# Patient Record
Sex: Male | Born: 1960 | Race: Black or African American | Hispanic: No | Smoking: Never smoker
Health system: Southern US, Community
[De-identification: ages and names within clinical notes are randomized; demographics above are authoritative.]

## PROBLEM LIST (undated history)

## (undated) DIAGNOSIS — R569 Unspecified convulsions: Secondary | ICD-10-CM

## (undated) DIAGNOSIS — Z952 Presence of prosthetic heart valve: Secondary | ICD-10-CM

## (undated) HISTORY — PX: CORONARY ARTERY BYPASS GRAFT: SHX141

## (undated) HISTORY — PX: CARDIAC VALVE REPLACEMENT: SHX585

---

## 1997-08-28 ENCOUNTER — Emergency Department (HOSPITAL_COMMUNITY): Admission: EM | Admit: 1997-08-28 | Discharge: 1997-08-28 | Payer: Self-pay | Admitting: Emergency Medicine

## 1997-10-15 ENCOUNTER — Emergency Department (HOSPITAL_COMMUNITY): Admission: EM | Admit: 1997-10-15 | Discharge: 1997-10-15 | Payer: Self-pay | Admitting: Emergency Medicine

## 2004-12-10 ENCOUNTER — Emergency Department (HOSPITAL_COMMUNITY): Admission: EM | Admit: 2004-12-10 | Discharge: 2004-12-11 | Payer: Self-pay | Admitting: Emergency Medicine

## 2005-04-04 ENCOUNTER — Encounter: Admission: RE | Admit: 2005-04-04 | Discharge: 2005-04-04 | Payer: Self-pay | Admitting: Cardiology

## 2005-04-25 ENCOUNTER — Encounter: Payer: Self-pay | Admitting: *Deleted

## 2005-04-25 ENCOUNTER — Observation Stay (HOSPITAL_COMMUNITY): Admission: AD | Admit: 2005-04-25 | Discharge: 2005-04-26 | Payer: Self-pay | Admitting: Cardiology

## 2005-07-23 ENCOUNTER — Ambulatory Visit (HOSPITAL_COMMUNITY): Admission: RE | Admit: 2005-07-23 | Discharge: 2005-07-23 | Payer: Self-pay | Admitting: Orthopedic Surgery

## 2006-05-22 ENCOUNTER — Emergency Department (HOSPITAL_COMMUNITY): Admission: EM | Admit: 2006-05-22 | Discharge: 2006-05-23 | Payer: Self-pay | Admitting: Emergency Medicine

## 2007-01-27 ENCOUNTER — Encounter
Admission: RE | Admit: 2007-01-27 | Discharge: 2007-01-27 | Payer: Self-pay | Admitting: Physical Medicine and Rehabilitation

## 2010-05-12 ENCOUNTER — Encounter: Payer: Self-pay | Admitting: Cardiology

## 2010-09-02 ENCOUNTER — Encounter (HOSPITAL_COMMUNITY): Payer: Non-veteran care

## 2010-09-04 ENCOUNTER — Encounter (HOSPITAL_COMMUNITY): Payer: Non-veteran care

## 2010-09-06 ENCOUNTER — Encounter (HOSPITAL_COMMUNITY): Payer: Non-veteran care

## 2010-09-09 ENCOUNTER — Encounter (HOSPITAL_COMMUNITY): Payer: Non-veteran care

## 2010-09-11 ENCOUNTER — Encounter (HOSPITAL_COMMUNITY): Payer: Non-veteran care

## 2010-09-13 ENCOUNTER — Encounter (HOSPITAL_COMMUNITY): Payer: Non-veteran care

## 2010-09-16 ENCOUNTER — Encounter (HOSPITAL_COMMUNITY): Payer: Non-veteran care

## 2010-09-18 ENCOUNTER — Encounter (HOSPITAL_COMMUNITY): Payer: Non-veteran care

## 2010-09-20 ENCOUNTER — Encounter (HOSPITAL_COMMUNITY): Payer: Non-veteran care

## 2010-09-23 ENCOUNTER — Encounter (HOSPITAL_COMMUNITY): Payer: Non-veteran care

## 2010-09-25 ENCOUNTER — Encounter (HOSPITAL_COMMUNITY): Payer: Non-veteran care

## 2010-09-27 ENCOUNTER — Encounter (HOSPITAL_COMMUNITY): Payer: Non-veteran care

## 2010-09-30 ENCOUNTER — Encounter (HOSPITAL_COMMUNITY): Payer: Non-veteran care

## 2010-10-02 ENCOUNTER — Encounter (HOSPITAL_COMMUNITY): Payer: Non-veteran care

## 2010-10-04 ENCOUNTER — Encounter (HOSPITAL_COMMUNITY): Payer: Non-veteran care

## 2010-10-07 ENCOUNTER — Encounter (HOSPITAL_COMMUNITY): Payer: Non-veteran care

## 2010-10-09 ENCOUNTER — Encounter (HOSPITAL_COMMUNITY): Payer: Non-veteran care

## 2010-10-11 ENCOUNTER — Encounter (HOSPITAL_COMMUNITY): Payer: Non-veteran care

## 2010-10-14 ENCOUNTER — Encounter (HOSPITAL_COMMUNITY): Payer: Non-veteran care

## 2010-10-16 ENCOUNTER — Encounter (HOSPITAL_COMMUNITY): Payer: Non-veteran care

## 2010-10-18 ENCOUNTER — Encounter (HOSPITAL_COMMUNITY): Payer: Non-veteran care

## 2010-10-21 ENCOUNTER — Encounter (HOSPITAL_COMMUNITY): Payer: Non-veteran care

## 2010-10-23 ENCOUNTER — Encounter (HOSPITAL_COMMUNITY): Payer: Non-veteran care

## 2010-10-25 ENCOUNTER — Encounter (HOSPITAL_COMMUNITY): Payer: Non-veteran care

## 2010-10-28 ENCOUNTER — Encounter (HOSPITAL_COMMUNITY): Payer: Non-veteran care

## 2010-10-30 ENCOUNTER — Encounter (HOSPITAL_COMMUNITY): Payer: Non-veteran care

## 2010-11-01 ENCOUNTER — Encounter (HOSPITAL_COMMUNITY): Payer: Non-veteran care

## 2010-11-04 ENCOUNTER — Encounter (HOSPITAL_COMMUNITY): Payer: Non-veteran care

## 2010-11-06 ENCOUNTER — Encounter (HOSPITAL_COMMUNITY): Payer: Non-veteran care

## 2010-11-08 ENCOUNTER — Encounter (HOSPITAL_COMMUNITY): Payer: Non-veteran care

## 2010-11-11 ENCOUNTER — Encounter (HOSPITAL_COMMUNITY): Payer: Non-veteran care

## 2010-11-13 ENCOUNTER — Encounter (HOSPITAL_COMMUNITY): Payer: Non-veteran care

## 2010-11-15 ENCOUNTER — Encounter (HOSPITAL_COMMUNITY): Payer: Non-veteran care

## 2010-11-18 ENCOUNTER — Encounter (HOSPITAL_COMMUNITY): Payer: Non-veteran care

## 2010-11-20 ENCOUNTER — Encounter (HOSPITAL_COMMUNITY): Payer: Non-veteran care

## 2010-11-22 ENCOUNTER — Encounter (HOSPITAL_COMMUNITY): Payer: Non-veteran care

## 2010-11-25 ENCOUNTER — Encounter (HOSPITAL_COMMUNITY): Payer: Non-veteran care

## 2010-11-27 ENCOUNTER — Encounter (HOSPITAL_COMMUNITY): Payer: Non-veteran care

## 2010-11-29 ENCOUNTER — Encounter (HOSPITAL_COMMUNITY): Payer: Non-veteran care

## 2010-12-02 ENCOUNTER — Encounter (HOSPITAL_COMMUNITY): Payer: Non-veteran care

## 2010-12-04 ENCOUNTER — Encounter (HOSPITAL_COMMUNITY): Payer: Non-veteran care

## 2010-12-06 ENCOUNTER — Encounter (HOSPITAL_COMMUNITY): Payer: Non-veteran care

## 2011-03-31 ENCOUNTER — Other Ambulatory Visit: Payer: Self-pay | Admitting: Cardiology

## 2011-03-31 DIAGNOSIS — R2240 Localized swelling, mass and lump, unspecified lower limb: Secondary | ICD-10-CM

## 2011-04-02 ENCOUNTER — Other Ambulatory Visit: Payer: Non-veteran care

## 2011-04-07 ENCOUNTER — Ambulatory Visit
Admission: RE | Admit: 2011-04-07 | Discharge: 2011-04-07 | Disposition: A | Discharge status: Home / Self Care | Payer: PRIVATE HEALTH INSURANCE | Source: Ambulatory Visit | Attending: Cardiology | Admitting: Cardiology

## 2011-04-07 DIAGNOSIS — R2240 Localized swelling, mass and lump, unspecified lower limb: Secondary | ICD-10-CM

## 2011-04-07 MED ORDER — GADOBENATE DIMEGLUMINE 529 MG/ML IV SOLN
19.0000 mL | Freq: Once | INTRAVENOUS | Status: AC | PRN
  Administered 2011-04-07: 19 mL via INTRAVENOUS

## 2012-03-26 ENCOUNTER — Observation Stay (HOSPITAL_COMMUNITY)
Admission: EM | Admit: 2012-03-26 | Discharge: 2012-03-27 | Disposition: A | Discharge status: Home / Self Care | Payer: 59 | Attending: Internal Medicine | Admitting: Internal Medicine

## 2012-03-26 ENCOUNTER — Encounter (HOSPITAL_COMMUNITY): Payer: Self-pay

## 2012-03-26 DIAGNOSIS — G40909 Epilepsy, unspecified, not intractable, without status epilepticus: Secondary | ICD-10-CM | POA: Insufficient documentation

## 2012-03-26 DIAGNOSIS — Z951 Presence of aortocoronary bypass graft: Secondary | ICD-10-CM | POA: Insufficient documentation

## 2012-03-26 DIAGNOSIS — T22239A Burn of second degree of unspecified upper arm, initial encounter: Secondary | ICD-10-CM | POA: Insufficient documentation

## 2012-03-26 DIAGNOSIS — R569 Unspecified convulsions: Secondary | ICD-10-CM

## 2012-03-26 DIAGNOSIS — R4182 Altered mental status, unspecified: Principal | ICD-10-CM | POA: Insufficient documentation

## 2012-03-26 DIAGNOSIS — X088XXA Exposure to other specified smoke, fire and flames, initial encounter: Secondary | ICD-10-CM | POA: Insufficient documentation

## 2012-03-26 DIAGNOSIS — T22231A Burn of second degree of right upper arm, initial encounter: Secondary | ICD-10-CM

## 2012-03-26 HISTORY — DX: Unspecified convulsions: R56.9

## 2012-03-26 LAB — CBC
HCT: 43.2 % (ref 39.0–52.0)
Hemoglobin: 15.1 g/dL (ref 13.0–17.0)
MCH: 29.3 pg (ref 26.0–34.0)
MCHC: 35 g/dL (ref 30.0–36.0)
MCV: 83.7 fL (ref 78.0–100.0)
Platelets: 230 10*3/uL (ref 150–400)
RBC: 5.16 MIL/uL (ref 4.22–5.81)
RDW: 13.4 % (ref 11.5–15.5)
WBC: 7.9 10*3/uL (ref 4.0–10.5)

## 2012-03-26 LAB — URINALYSIS, ROUTINE W REFLEX MICROSCOPIC
Bilirubin Urine: NEGATIVE
Glucose, UA: NEGATIVE mg/dL
Ketones, ur: NEGATIVE mg/dL
Leukocytes, UA: NEGATIVE
Nitrite: NEGATIVE
Protein, ur: 30 mg/dL — AB
Specific Gravity, Urine: 1.015 (ref 1.005–1.030)
Urobilinogen, UA: 0.2 mg/dL (ref 0.0–1.0)
pH: 5.5 (ref 5.0–8.0)

## 2012-03-26 LAB — BASIC METABOLIC PANEL
BUN: 11 mg/dL (ref 6–23)
CO2: 22 mEq/L (ref 19–32)
Calcium: 9.7 mg/dL (ref 8.4–10.5)
Chloride: 106 mEq/L (ref 96–112)
Creatinine, Ser: 1.06 mg/dL (ref 0.50–1.35)
GFR calc Af Amer: >90 mL/min (ref >90)
GFR calc non Af Amer: 80 mL/min — ABNORMAL LOW (ref >90)
Glucose, Bld: 108 mg/dL — ABNORMAL HIGH (ref 70–99)
Potassium: 4.3 mEq/L (ref 3.5–5.1)
Sodium: 140 mEq/L (ref 135–145)

## 2012-03-26 LAB — URINE MICROSCOPIC-ADD ON

## 2012-03-26 LAB — RAPID URINE DRUG SCREEN, HOSP PERFORMED
Amphetamines: NOT DETECTED
Barbiturates: NOT DETECTED
Benzodiazepines: NOT DETECTED
Cocaine: NOT DETECTED
Opiates: NOT DETECTED
Tetrahydrocannabinol: NOT DETECTED

## 2012-03-26 MED ORDER — SODIUM CHLORIDE 0.9 % IV SOLN
1000.0000 mg | Freq: Once | INTRAVENOUS | Status: DC
  Filled 2012-03-26: qty 10

## 2012-03-26 MED ORDER — LORAZEPAM 2 MG/ML IJ SOLN
INTRAMUSCULAR | Status: AC
  Administered 2012-03-26: 2 mg via INTRAVENOUS
  Filled 2012-03-26: qty 1

## 2012-03-26 MED ORDER — LORAZEPAM 2 MG/ML IJ SOLN
2.0000 mg | Freq: Once | INTRAMUSCULAR | Status: AC
  Administered 2012-03-26: 2 mg via INTRAVENOUS

## 2012-03-26 MED ORDER — FOSPHENYTOIN SODIUM 500 MG PE/10ML IJ SOLN
1800.0000 mg | Freq: Once | INTRAMUSCULAR | Status: DC
  Filled 2012-03-26: qty 36

## 2012-03-26 NOTE — ED Notes (Signed)
Per EMS, pt sister called EMS for "seizure-like activity". Pt. Confused. Denies hx of same.

## 2012-03-26 NOTE — ED Notes (Signed)
Pt father-in-law at bedside. States LSN yesterday, reports pt was quiet today but seemed to know what was happening but was a little "off". Left patient at home and received a call about an hour later from pt ex-wife stating pt was having a seizure. Pt. Ex-wife had called pt and noticed pt was not acting right. Ex-wife reports hx of seizures, father-in-law unaware of this hx, states "I have never seen anything like a seizure, but pt is very private person".

## 2012-03-26 NOTE — ED Notes (Signed)
Clifford Sullivan (father-in-law): home [phone] : cell [phone]
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
## 2012-03-26 NOTE — ED Notes (Signed)
Dallan Schonberg (ex-wife): [phone]

## 2012-03-26 NOTE — ED Notes (Signed)
Heard pt yelling from nurses station. This nurse went into room, pt sitting up in bed yelling then began to have a generalized grand-mal seizure lasting approx 1 minute, eyes deviated to left. Dr. Juleen China at bedside. 2 mg Ativan given. Pt. Suctioned, put on non-re breather. Pt. Postictal, pulling at oxygen, heavy breathing.
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
## 2012-03-26 NOTE — ED Notes (Signed)
Neurologist at bedside. 

## 2012-03-26 NOTE — Consult Note (Signed)
Reason for Consult:Seizure Referring Physician: Juleen China, S  CC: Confusion  History is obtained from:Patient's ex-wife, ex-father in law.   HPI: Clifford Sullivan is a 51 y.o. male with a hsitory of seizures, typically consisting of a staring spell and confusion. Afterwards, he can have persistent post-ictal confusion for up to several hours. His ex-wife thinks that he has had weakness with previous episodes, but is not sure which side.   In the ER here, he had a generalized seizure, and eyes were deviated to the left. It lasted for approximately 20 seconds, and spontaneously subsided. Afterwards, he was weak on the left side.   He lives with his ex father-in-law who states that "he is a very private person" but is not aware of any recent changes that would have made him have difficulty finding.    ROS: A 14 point ROS was performed and is negative except as noted in the HPI.  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  Family History: Unable to obtain due to ams  Social History: Tob: does not smoke or drink (per father in Social worker)  Exam: Current vital signs: BP 143/84  Pulse 124  Temp 98.4 F (36.9 C) (Oral)  Resp 40  SpO2 100% Vital signs in last 24 hours: Temp:  [98.4 F (36.9 C)] 98.4 F (36.9 C) (12/06 2034) Pulse Rate:  [87-126] 124  (12/06 2301) Resp:  [18-41] 40  (12/06 2301) BP: (143-193)/(84-109) 143/84 mmHg (12/06 2301) SpO2:  [95 %-100 %] 100 % (12/06 2301)  General: In bed, nad CV: rrr Mental Status: Patient is awake, post ictal He states his name, and can correctly identify his father in law in the room with him.  He is unable to give a clear history.  Cranial Nerves: II: Visual Fields are full. Pupils are equal, round, and reactive to light.  Discs are difficult to visualize. III,IV, VI: EOMI without ptosis or diploplia.  V: Facial sensation is symmetric to temperature VII: Facial movement is symmetric.  VIII: hearing is intact to voice X: Uvula elevates  symmetrically XI: Shoulder shrug is symmetric. XII: tongue is midline without atrophy or fasciculations.  Motor: Tone is normal. Bulk is normal. Moves right arm and leg well, left arm was initially plegic, but improved with some residual weakness over the course of the exam.  His left leg withdrew to noxious stimuli, but also appeared to have some weakness.  Sensory: Responds to nox stim bilaterally Deep Tendon Reflexes: 2+ and symmetric in the biceps and patellae. Plantars: Toes are downgoing bilaterally.  Cerebellar: Unable to assess due to AMS.  Gait: Unable to assess due to ams  I have reviewed labs in epic and the results pertinent to this consultation are: UA - no infection No leukocytosis.  BMP nml  I have reviewed the images obtained: -  Impression: 51 yo M with previous seizure history and breakthrough seizures. He is post-ictal at the current time, but having some improvement. I suspect his left sided weakness is a todd's phenomenon, but if not back to baseline by morning, would perform MRI/EEG.   Recommendations: 1) Dilantin level.  2) If not back to baseline 3) CT head 4) If Dilantin Theraputic, load with Keppra  5) If dilantin subtheraputic, load with fosphenytoin 20 PE/KG  Ritta Slot, MD [facility] [phone]  If 7pm- 7am, please page neurology on call at [phone].

## 2012-03-27 ENCOUNTER — Encounter (HOSPITAL_COMMUNITY): Payer: Self-pay

## 2012-03-27 ENCOUNTER — Emergency Department (HOSPITAL_COMMUNITY): Payer: 59

## 2012-03-27 DIAGNOSIS — T22239A Burn of second degree of unspecified upper arm, initial encounter: Secondary | ICD-10-CM

## 2012-03-27 DIAGNOSIS — R569 Unspecified convulsions: Secondary | ICD-10-CM | POA: Diagnosis present

## 2012-03-27 LAB — HEPATIC FUNCTION PANEL
ALT: 10 U/L (ref 0–53)
AST: 21 U/L (ref 0–37)
Albumin: 3.7 g/dL (ref 3.5–5.2)
Alkaline Phosphatase: 49 U/L (ref 39–117)
Bilirubin, Direct: 0.1 mg/dL (ref 0.0–0.3)
Indirect Bilirubin: 0.5 mg/dL (ref 0.3–0.9)
Total Bilirubin: 0.6 mg/dL (ref 0.3–1.2)
Total Protein: 6.7 g/dL (ref 6.0–8.3)

## 2012-03-27 LAB — BASIC METABOLIC PANEL
BUN: 8 mg/dL (ref 6–23)
CO2: 23 mEq/L (ref 19–32)
Chloride: 107 mEq/L (ref 96–112)
GFR calc Af Amer: >90 mL/min (ref >90)
Potassium: 4.4 mEq/L (ref 3.5–5.1)

## 2012-03-27 LAB — CBC
HCT: 39.7 % (ref 39.0–52.0)
Hemoglobin: 13.8 g/dL (ref 13.0–17.0)
MCV: 84.3 fL (ref 78.0–100.0)
WBC: 7.2 10*3/uL (ref 4.0–10.5)

## 2012-03-27 LAB — PHENYTOIN LEVEL, TOTAL: Phenytoin Lvl: <2.5 ug/mL — ABNORMAL LOW (ref 10.0–20.0)

## 2012-03-27 LAB — PROTIME-INR
INR: 2.76 — ABNORMAL HIGH (ref 0.00–1.49)
Prothrombin Time: 27.8 seconds — ABNORMAL HIGH (ref 11.6–15.2)

## 2012-03-27 LAB — MAGNESIUM: Magnesium: 2.4 mg/dL (ref 1.5–2.5)

## 2012-03-27 LAB — CK: Total CK: 368 U/L — ABNORMAL HIGH (ref 7–232)

## 2012-03-27 LAB — ETHANOL: Alcohol, Ethyl (B): <11 mg/dL (ref 0–11)

## 2012-03-27 MED ORDER — PHENYTOIN SODIUM EXTENDED 100 MG PO CAPS: 300.0000 mg | ORAL_CAPSULE | Freq: Once | ORAL | Status: DC

## 2012-03-27 MED ORDER — HYDROCODONE-ACETAMINOPHEN 5-325 MG PO TABS
1.0000 | ORAL_TABLET | ORAL | Status: DC | PRN
  Administered 2012-03-27: 2 via ORAL
  Filled 2012-03-27: qty 2

## 2012-03-27 MED ORDER — LEVETIRACETAM 500 MG PO TABS
500.0000 mg | ORAL_TABLET | Freq: Two times a day (BID) | ORAL | Status: DC
Start: 2012-03-27 — End: 2012-03-27
  Administered 2012-03-27: 500 mg via ORAL
  Filled 2012-03-27 (×2): qty 1

## 2012-03-27 MED ORDER — ENOXAPARIN SODIUM 30 MG/0.3ML ~~LOC~~ SOLN
30.0000 mg | SUBCUTANEOUS | Status: DC
  Administered 2012-03-27: 30 mg via SUBCUTANEOUS
  Filled 2012-03-27: qty 0.3

## 2012-03-27 MED ORDER — SODIUM CHLORIDE 0.9 % IV SOLN
20.0000 mg/kg | Freq: Once | INTRAVENOUS | Status: AC
  Administered 2012-03-27: 1706 mg via INTRAVENOUS
  Filled 2012-03-27: qty 34.12

## 2012-03-27 MED ORDER — ASPIRIN 325 MG PO TABS
325.0000 mg | ORAL_TABLET | Freq: Every day | ORAL | Status: DC
  Administered 2012-03-27: 325 mg via ORAL
  Filled 2012-03-27: qty 1

## 2012-03-27 MED ORDER — SODIUM CHLORIDE 0.9 % IV SOLN: INTRAVENOUS | Status: AC

## 2012-03-27 MED ORDER — LEVETIRACETAM 500 MG PO TABS: 500.0000 mg | ORAL_TABLET | Freq: Two times a day (BID) | ORAL | Status: DC

## 2012-03-27 MED ORDER — ONDANSETRON HCL 4 MG/2ML IJ SOLN: 4.0000 mg | Freq: Four times a day (QID) | INTRAMUSCULAR | Status: DC | PRN

## 2012-03-27 MED ORDER — ONDANSETRON HCL 4 MG PO TABS: 4.0000 mg | ORAL_TABLET | Freq: Four times a day (QID) | ORAL | Status: DC | PRN

## 2012-03-27 NOTE — ED Notes (Signed)
Pt. Alert and oriented to person and place. Pt. Has increased ability to recall past events that wasn't previously aware of. Able to inform this nurse of hx of seizures and past medical history. Still confused to time and situation.

## 2012-03-27 NOTE — ED Notes (Signed)
Dr. Kohut at bedside 

## 2012-03-27 NOTE — H&P (Signed)
[facility] History and Physical  Clifford Sullivan:[id number] DOB: Apr 06, 1961 DOA: 03/26/2012  Referring physician: ED physician PCP: Sheila Oats, MD   Chief Complaint: Seizure  HPI:  Pt is 51 yo male who was brought in to ED for further evaluation of what appeared to be seizure episode at home few hours earlier prior to admission. Pt appeared confussed and was starring for nearly 10-15 minutes but confusion lasted several hours. Pt is unable to provide detailed history and most of this information obtained from records and ED doctor.  ED EVENTS: another generalized seizure noted, pt deviated eyes to the left side with again post ictal confusion, loaded with phosphenytoin  Assessment and Plan: Principal Problem:  *Seizure - unclear what the provoking etiology was for this episode - will admit the pt to medical floor for further evaluation - appreciate neurology recommendations - CT head unremarkable - will provide supportive care for now: IVF, analgesia if needed - perform neuro checks per protocol - since dilantin level was low phosphenytoin was loaded  - will continue to follow up on neurology rec's  Code Status: Full Family Communication: No family at bedside Disposition Plan: Admit to medical floor  Review of Systems:  Unable to provide due to post ictal state    History reviewed. No pertinent past medical history.  Past Surgical History  Procedure Date  . Coronary artery bypass graft     Social History:  does not have a smoking history on file. He does not have any smokeless tobacco history on file. His alcohol and drug histories not on file.  No Known Allergies  Pt is in post ictal state and unable to provide  Prior to Admission medications   Not on File    Physical Exam: Filed Vitals:   03/26/12 2301 03/26/12 2342 03/27/12 0052 03/27/12 0212  BP: 143/84   144/97  Pulse: 124   93  Temp:  98.4 F (36.9 C)    TempSrc:  Rectal    Resp: 40   25   Height:   6\' 2"  (1.88 m)   Weight:   85.276 kg (188 lb)   SpO2: 100%   98%    Physical Exam  Constitutional: Appears well-developed and well-nourished. No distress.  HENT: Normocephalic. External right and left ear normal. Oropharynx is clear and moist.  Eyes: Conjunctivae and EOM are normal. PERRLA, no scleral icterus.  Neck: Normal ROM. Neck supple. No JVD. No tracheal deviation. No thyromegaly.  CVS: Regular rhythm, tachycardic, S1/S2 +, no murmurs, no gallops, no carotid bruit.  Pulmonary: Effort and breath sounds normal, no stridor, rhonchi, wheezes, rales.  Abdominal: Soft. BS +,  no distension, tenderness, rebound or guarding.  Musculoskeletal: Normal range of motion. No edema and no tenderness.  Lymphadenopathy: No lymphadenopathy noted, cervical, inguinal. Neuro: Post ictal, left upper and lower extremity weakness> right side Skin: Skin is warm and dry. No rash noted. Not diaphoretic. No erythema. No pallor.  Psychiatric: Normal mood and affect. Behavior, judgment, thought content normal.   Labs on Admission:  Basic Metabolic Panel:  Lab 03/26/12 8119 03/26/12 2121  NA -- 140  K -- 4.3  CL -- 106  CO2 -- 22  GLUCOSE -- 108*  BUN -- 11  CREATININE -- 1.06  CALCIUM -- 9.7  MG 2.4 --  PHOS -- --   Liver Function Tests:  Lab 03/26/12 2327  AST 21  ALT 10  ALKPHOS 49  BILITOT 0.6  PROT 6.7  ALBUMIN 3.7  CBC:  Lab 03/26/12 2121  WBC 7.9  NEUTROABS --  HGB 15.1  HCT 43.2  MCV 83.7  PLT 230   Cardiac Enzymes:  Lab 03/26/12 2327  CKTOTAL 368*  CKMB --  CKMBINDEX --  TROPONINI --    Radiological Exams on Admission: Ct Head Wo Contrast  03/27/2012  *RADIOLOGY REPORT*  Clinical Data: Seizures.  Headache.  CT HEAD WITHOUT CONTRAST  Technique:  Contiguous axial images were obtained from the base of the skull through the vertex without contrast.  Comparison: 05/22/2006.  Findings: No acute intracranial abnormality.  Specifically, no hemorrhage,  hydrocephalus, mass lesion, acute infarction, or significant intracranial injury.  No acute calvarial abnormality. Visualized paranasal sinuses and mastoids clear.  Orbital soft tissues unremarkable.  IMPRESSION: No acute intracranial abnormality.   Original Report Authenticated By: Charlett Nose, M.D.     EKG: Normal sinus rhythm, no ST/T wave changes  Debbora Presto, MD  [facility] Pager [phone]  If 7PM-7AM, please contact night-coverage [email or web address] Password [email or web address] 03/27/2012, 2:20 AM

## 2012-03-27 NOTE — Progress Notes (Signed)
DC instructions given to pt and pt's father in law re: new medications and continuing all home medications except for Dilantin, s/s of problems, community resources, and MyChart.  Pt and father in law verbalize understanding of all instructions.  Pt has not had any seizure activity this shift.  No s/s of any acute distress noted.

## 2012-03-27 NOTE — Discharge Summary (Signed)
Physician Discharge Summary  GIOVANNE NICKOLSON MRN: [id number] DOB/AGE: 1960-12-14 51 y.o.  PCP: Sheila Oats, MD   Admit date: 03/26/2012 Discharge date: 03/27/2012  Discharge Diagnoses:    Principal Problem:  *Sullivan     Medication List     As of 03/27/2012  8:18 AM    TAKE these medications         levETIRAcetam 500 MG tablet   Commonly known as: KEPPRA   Take 1 tablet (500 mg total) by mouth 2 (two) times daily.        Discharge Condition: Stable Disposition:    Consults: Neurology  Significant Diagnostic Studies: Ct Head Wo Contrast  03/27/2012  *RADIOLOGY REPORT*  Clinical Data: Sullivan.  Headache.  CT HEAD WITHOUT CONTRAST  Technique:  Contiguous axial images were obtained from the base of the skull through the vertex without contrast.  Comparison: 05/22/2006.  Findings: No acute intracranial abnormality.  Specifically, no hemorrhage, hydrocephalus, mass lesion, acute infarction, or significant intracranial injury.  No acute calvarial abnormality. Visualized paranasal sinuses Clifford mastoids clear.  Orbital soft tissues unremarkable.  IMPRESSION: No acute intracranial abnormality.   Original Report Authenticated By: Charlett Nose, M.D.       Microbiology: No results found for this or any previous visit (from the past 240 hour(s)).   Labs: Results for orders placed during the hospital encounter of 03/26/12 (from the past 48 hour(s))  CBC     Status: Normal   Collection Time   03/26/12  9:21 PM      Component Value Range Comment   WBC 7.9  4.0 - 10.5 K/uL    RBC 5.16  4.22 - 5.81 MIL/uL    Hemoglobin 15.1  13.0 - 17.0 g/dL    HCT 40.9  81.1 - 91.4 %    MCV 83.7  78.0 - 100.0 fL    MCH 29.3  26.0 - 34.0 pg    MCHC 35.0  30.0 - 36.0 g/dL    RDW 78.2  95.6 - 21.3 %    Platelets 230  150 - 400 K/uL   BASIC METABOLIC PANEL     Status: Abnormal   Collection Time   03/26/12  9:21 PM      Component Value Range Comment   Sodium 140  135 - 145 mEq/L     Potassium 4.3  3.5 - 5.1 mEq/L    Chloride 106  96 - 112 mEq/L    CO2 22  19 - 32 mEq/L    Glucose, Bld 108 (*) 70 - 99 mg/dL    BUN 11  6 - 23 mg/dL    Creatinine, Ser 0.86  0.50 - 1.35 mg/dL    Calcium 9.7  8.4 - 57.8 mg/dL    GFR calc non Af Amer 80 (*) >90 mL/min    GFR calc Af Amer >90  >90 mL/min   URINALYSIS, ROUTINE W REFLEX MICROSCOPIC     Status: Abnormal   Collection Time   03/26/12 10:35 PM      Component Value Range Comment   Color, Urine YELLOW  YELLOW    APPearance CLOUDY (*) CLEAR    Specific Gravity, Urine 1.015  1.005 - 1.030    pH 5.5  5.0 - 8.0    Glucose, UA NEGATIVE  NEGATIVE mg/dL    Hgb urine dipstick LARGE (*) NEGATIVE    Bilirubin Urine NEGATIVE  NEGATIVE    Ketones, ur NEGATIVE  NEGATIVE mg/dL    Protein, ur 30 (*)  NEGATIVE mg/dL    Urobilinogen, UA 0.2  0.0 - 1.0 mg/dL    Nitrite NEGATIVE  NEGATIVE    Leukocytes, UA NEGATIVE  NEGATIVE   URINE RAPID DRUG SCREEN (HOSP PERFORMED)     Status: Normal   Collection Time   03/26/12 10:35 PM      Component Value Range Comment   Opiates NONE DETECTED  NONE DETECTED    Cocaine NONE DETECTED  NONE DETECTED    Benzodiazepines NONE DETECTED  NONE DETECTED    Amphetamines NONE DETECTED  NONE DETECTED    Tetrahydrocannabinol NONE DETECTED  NONE DETECTED    Barbiturates NONE DETECTED  NONE DETECTED   URINE MICROSCOPIC-ADD ON     Status: Normal   Collection Time   03/26/12 10:35 PM      Component Value Range Comment   WBC, UA 0-2  <3 WBC/hpf    RBC / HPF 0-2  <3 RBC/hpf   ETHANOL     Status: Normal   Collection Time   03/26/12 11:27 PM      Component Value Range Comment   Alcohol, Ethyl (B) <11  0 - 11 mg/dL   CK     Status: Abnormal   Collection Time   03/26/12 11:27 PM      Component Value Range Comment   Total CK 368 (*) 7 - 232 U/L   HEPATIC FUNCTION PANEL     Status: Normal   Collection Time   03/26/12 11:27 PM      Component Value Range Comment   Total Protein 6.7  6.0 - 8.3 g/dL    Albumin 3.7  3.5 -  5.2 g/dL    AST 21  0 - 37 U/L    ALT 10  0 - 53 U/L    Alkaline Phosphatase 49  39 - 117 U/L    Total Bilirubin 0.6  0.3 - 1.2 mg/dL    Bilirubin, Direct 0.1  0.0 - 0.3 mg/dL    Indirect Bilirubin 0.5  0.3 - 0.9 mg/dL   MAGNESIUM     Status: Normal   Collection Time   03/26/12 11:27 PM      Component Value Range Comment   Magnesium 2.4  1.5 - 2.5 mg/dL   PROTIME-INR     Status: Abnormal   Collection Time   03/26/12 11:27 PM      Component Value Range Comment   Prothrombin Time 27.8 (*) 11.6 - 15.2 Sullivan    INR 2.76 (*) 0.00 - 1.49   PHENYTOIN LEVEL, TOTAL     Status: Abnormal   Collection Time   03/26/12 11:27 PM      Component Value Range Comment   Phenytoin Lvl <2.5 (*) 10.0 - 20.0 ug/mL   BASIC METABOLIC PANEL     Status: Abnormal   Collection Time   03/27/12  6:45 AM      Component Value Range Comment   Sodium 141  135 - 145 mEq/L    Potassium 4.4  3.5 - 5.1 mEq/L HEMOLYSIS AT THIS LEVEL MAY AFFECT RESULT   Chloride 107  96 - 112 mEq/L    CO2 23  19 - 32 mEq/L    Glucose, Bld 124 (*) 70 - 99 mg/dL    BUN 8  6 - 23 mg/dL    Creatinine, Ser 9.60  0.50 - 1.35 mg/dL    Calcium 9.0  8.4 - 45.4 mg/dL    GFR calc non Af Amer 84 (*) >90 mL/min  GFR calc Af Amer >90  >90 mL/min   CBC     Status: Normal   Collection Time   03/27/12  6:45 AM      Component Value Range Comment   WBC 7.2  4.0 - 10.5 K/uL    RBC 4.71  4.22 - 5.81 MIL/uL    Hemoglobin 13.8  13.0 - 17.0 g/dL    HCT 45.4  09.8 - 11.9 %    MCV 84.3  78.0 - 100.0 fL    MCH 29.3  26.0 - 34.0 pg    MCHC 34.8  30.0 - 36.0 g/dL    RDW 14.7  82.9 - 56.2 %    Platelets 214  150 - 400 K/uL      Clifford Sullivan, Clifford Sullivan, Clifford Sullivan, Clifford is not sure which side.  In the ER Sullivan, Clifford Sullivan,  Clifford Sullivan, Clifford Sullivan, Clifford Sullivan on the left side.  Clifford lives with his ex father-in-law who states that "Clifford is a very private person" Clifford is not aware of any recent changes that would have made him have difficulty finding. Clifford stated that Clifford had not been taking his Dilantin for the past several days, Clifford has one Sullivan one month   HOSPITAL COURSE:  #1 Sullivan disorder Dilantin level is subtherapeutic The patient was loaded with fosphenytoin CT scan of the head was negative Patient has been initiated on Keppra Patient has been instructed not to drive The patient is to followup with his PCP next week Urine drug screen was negative    #2 history of mitral valve replacement On Coumadin Given his history of Sullivan patient is a high-risk for trauma injury   Discharge Exam:  Blood pressure 145/90, pulse 97, temperature 98.9 F (37.2 C), temperature source Oral, resp. rate 18, height 6\' 2"  (1.88 m), weight 85.276 kg (188 lb), SpO2 97.00%.  Head: Normocephalic Clifford atraumatic.  No external signs of acute trauma  Eyes: Conjunctivae normal are normal. Right eye exhibits no discharge. Left eye exhibits no discharge.  Neck: Neck supple.  No nuchal rigidity  Cardiovascular: Normal rate, regular rhythm Clifford normal heart sounds. Exam reveals no gallop Clifford no friction rub.  No murmur heard. Sternotomy scar. Systolic murmur.  Pulmonary/Chest: Effort normal Clifford breath sounds normal. No respiratory distress.  Abdominal: Soft. Clifford exhibits no distension. There is no tenderness.  Musculoskeletal: Clifford exhibits no edema Clifford no tenderness.  Neurological:  Patient with witnessed generalized Sullivan lasting approximately 20 Sullivan Clifford subsided without intervention. No incontinence. No oral trauma.         SignedRicharda Overlie 03/27/2012, 8:18 AM

## 2012-07-28 ENCOUNTER — Encounter (HOSPITAL_COMMUNITY): Payer: Self-pay | Admitting: Emergency Medicine

## 2012-07-28 ENCOUNTER — Emergency Department (HOSPITAL_COMMUNITY)
Admission: EM | Admit: 2012-07-28 | Discharge: 2012-07-28 | Disposition: A | Discharge status: Home / Self Care | Payer: Managed Care, Other (non HMO) | Attending: Emergency Medicine | Admitting: Emergency Medicine

## 2012-07-28 DIAGNOSIS — R569 Unspecified convulsions: Secondary | ICD-10-CM

## 2012-07-28 DIAGNOSIS — Z951 Presence of aortocoronary bypass graft: Secondary | ICD-10-CM | POA: Insufficient documentation

## 2012-07-28 DIAGNOSIS — Z79899 Other long term (current) drug therapy: Secondary | ICD-10-CM | POA: Insufficient documentation

## 2012-07-28 DIAGNOSIS — G40909 Epilepsy, unspecified, not intractable, without status epilepticus: Secondary | ICD-10-CM | POA: Insufficient documentation

## 2012-07-28 LAB — POCT I-STAT, CHEM 8
BUN: 7 mg/dL (ref 6–23)
Chloride: 109 mEq/L (ref 96–112)
HCT: 51 % (ref 39.0–52.0)
Potassium: 4.2 mEq/L (ref 3.5–5.1)

## 2012-07-28 LAB — GLUCOSE, CAPILLARY

## 2012-07-28 MED ORDER — SODIUM CHLORIDE 0.9 % IV SOLN
INTRAVENOUS | Status: DC
  Administered 2012-07-28: 05:00:00 via INTRAVENOUS

## 2012-07-28 MED ORDER — LORAZEPAM 2 MG/ML IJ SOLN
1.0000 mg | Freq: Once | INTRAMUSCULAR | Status: AC
  Administered 2012-07-28: 1 mg via INTRAVENOUS
  Filled 2012-07-28: qty 1

## 2012-07-28 NOTE — ED Notes (Signed)
As per EMS, pt family witnessed seizure. Positive LOC.No N/V.pt alert pt postictal

## 2012-07-28 NOTE — ED Notes (Signed)
MD at bedside. 

## 2012-07-28 NOTE — ED Provider Notes (Signed)
History     CSN: [id number]  Arrival date & time 07/28/12  0408   First MD Initiated Contact with Patient 07/28/12 0422      Chief Complaint  Patient presents with  . Seizures    (Consider location/radiation/quality/duration/timing/severity/associated sxs/prior treatment) HPI History provided by patient and family bedside. [facility] with [facility] of seizures and takes Keppra. Gets a seizure about once every 6 months. Is taking Keppra as prescribed, he denies any new medications, missed doses, alcohol use or sleep deprivation. At home tonight, sleeping on the couch, family heard him cry out, found him lying on the floor after an apparent seizure and postictal state. EMS called. Patient noted to have a tongue lac right tongue. He denies any other pain injury trauma. At time of my evaluation he feels like he is back to his normal self. He is amnestic to event. Symptoms moderate in severity  Past Medical History  Diagnosis Date  . Seizures     Past Surgical History  Procedure Laterality Date  . Coronary artery bypass graft      No family history on file.  History  Substance Use Topics  . Smoking status: Never Smoker   . Smokeless tobacco: Not on file  . Alcohol Use: No      Review of Systems  Constitutional: Negative for fever and chills.  HENT: Negative for neck pain and neck stiffness.   Eyes: Negative for pain.  Respiratory: Negative for shortness of breath.   Cardiovascular: Negative for chest pain.  Gastrointestinal: Negative for abdominal pain.  Genitourinary: Negative for dysuria.  Musculoskeletal: Negative for back pain.  Skin: Negative for rash.  Neurological: Positive for seizures. Negative for headaches.  All other systems reviewed and are negative.    Allergies  Review of patient's allergies indicates no known allergies.  Home Medications   Current Outpatient Rx  Name  Route  Sig  Dispense  Refill  . levETIRAcetam (KEPPRA) 500 MG tablet   Oral  Take 1 tablet (500 mg total) by mouth 2 (two) times daily.   60 tablet   2     BP 140/104  Pulse 91  Temp(Src) 98.9 F (37.2 C) (Oral)  Resp 23  SpO2 97%  Physical Exam  Constitutional: He is oriented to person, place, and time. He appears well-developed and well-nourished.  HENT:  Head: Normocephalic.  Superficial right side tongue laceration with bleeding controlled. No dental tenderness.  Eyes: EOM are normal. Pupils are equal, round, and reactive to light.  Neck: Normal range of motion. Neck supple.  No C-spine tenderness or deformity  Cardiovascular: Normal rate, regular rhythm and intact distal pulses.   Pulmonary/Chest: Effort normal and breath sounds normal. No respiratory distress. He exhibits no tenderness.  Abdominal: Soft. Bowel sounds are normal. He exhibits no distension. There is no tenderness.  Musculoskeletal: Normal range of motion. He exhibits no edema.  Neurological: He is alert and oriented to person, place, and time. He has normal reflexes. No cranial nerve deficit. He exhibits normal muscle tone. Coordination normal.  Skin: Skin is warm and dry.    ED Course  Procedures (including critical care time)  Labs Reviewed  GLUCOSE, CAPILLARY - Abnormal; Notable for the following:    Glucose-Capillary 136 (*)    All other components within normal limits  POCT I-STAT, CHEM 8 - Abnormal; Notable for the following:    Glucose, Bld 135 (*)    Calcium, Ion 1.27 (*)    Hemoglobin 17.3 (*)  All other components within normal limits   IV fluids. IV Ativan.  Serial examinations with improved condition. Seizure precautions verbalized as understood. No seizure activity in the emergency dept MDM  Seizure with history of same  Reports taking Keppra as prescribed  Evaluated with labs reviewed as above  IV fluids and IV Ativan  Condition improved - has followup with neurology at [facility] - plan close outpatient followup.  Vital Signs and nursing notes  reviewed        Sunnie Nielsen, MD 07/28/12 [phone]

## 2012-08-19 ENCOUNTER — Inpatient Hospital Stay (HOSPITAL_COMMUNITY)
Admission: EM | Admit: 2012-08-19 | Discharge: 2012-08-23 | DRG: 100 | Disposition: A | Discharge status: Home / Self Care | Payer: 59 | Attending: Internal Medicine | Admitting: Internal Medicine

## 2012-08-19 ENCOUNTER — Encounter (HOSPITAL_COMMUNITY): Payer: Self-pay | Admitting: *Deleted

## 2012-08-19 DIAGNOSIS — N19 Unspecified kidney failure: Secondary | ICD-10-CM

## 2012-08-19 DIAGNOSIS — Z79899 Other long term (current) drug therapy: Secondary | ICD-10-CM

## 2012-08-19 DIAGNOSIS — G40401 Other generalized epilepsy and epileptic syndromes, not intractable, with status epilepticus: Principal | ICD-10-CM | POA: Diagnosis present

## 2012-08-19 DIAGNOSIS — Z951 Presence of aortocoronary bypass graft: Secondary | ICD-10-CM

## 2012-08-19 DIAGNOSIS — E872 Acidosis, unspecified: Secondary | ICD-10-CM | POA: Diagnosis present

## 2012-08-19 DIAGNOSIS — Z7901 Long term (current) use of anticoagulants: Secondary | ICD-10-CM

## 2012-08-19 DIAGNOSIS — Z954 Presence of other heart-valve replacement: Secondary | ICD-10-CM

## 2012-08-19 DIAGNOSIS — R569 Unspecified convulsions: Secondary | ICD-10-CM | POA: Diagnosis present

## 2012-08-19 DIAGNOSIS — I059 Rheumatic mitral valve disease, unspecified: Secondary | ICD-10-CM | POA: Diagnosis present

## 2012-08-19 DIAGNOSIS — G9340 Encephalopathy, unspecified: Secondary | ICD-10-CM | POA: Diagnosis present

## 2012-08-19 DIAGNOSIS — N179 Acute kidney failure, unspecified: Secondary | ICD-10-CM | POA: Diagnosis present

## 2012-08-19 DIAGNOSIS — G40901 Epilepsy, unspecified, not intractable, with status epilepticus: Secondary | ICD-10-CM

## 2012-08-19 HISTORY — DX: Presence of prosthetic heart valve: Z95.2

## 2012-08-19 LAB — CBC WITH DIFFERENTIAL/PLATELET
Basophils Absolute: 0 10*3/uL (ref 0.0–0.1)
Eosinophils Relative: 0 % (ref 0–5)
Lymphocytes Relative: 34 % (ref 12–46)
MCV: 83.7 fL (ref 78.0–100.0)
Neutrophils Relative %: 60 % (ref 43–77)
Platelets: 360 10*3/uL (ref 150–400)
RDW: 13.5 % (ref 11.5–15.5)
WBC: 9.3 10*3/uL (ref 4.0–10.5)

## 2012-08-19 LAB — PROTIME-INR
INR: 1.75 — ABNORMAL HIGH (ref 0.00–1.49)
Prothrombin Time: 19.8 seconds — ABNORMAL HIGH (ref 11.6–15.2)

## 2012-08-19 NOTE — ED Notes (Addendum)
According to family, pt. More confused, which started ~ 1830 and progressed up to now. During EMS transport pt. Had 45 sec. Grand mal seizure. Aura - confusion. EMS gave 2.5 mg versed iv. 20 ga. Rt. Hand. Vs 178/130, hr 118-120, rr 20's, sao2 100% nrb.~ 1830: family concerned b/c pt. Confused on the phone. Pt. Refused transport the first time. Tonite, pt. Agreed to come to hospital. Walked to EMS truck, and then proceed to have a grand mal seizure inbound to hospital.

## 2012-08-19 NOTE — ED Provider Notes (Signed)
History     CSN: [id number]  Arrival date & time 08/19/12  2300   First MD Initiated Contact with Patient 08/19/12 2303      No chief complaint on file.   (Consider location/radiation/quality/duration/timing/severity/associated sxs/prior treatment) HPI Comments: 80 y M with PMH of Seizures, mechanical heart valve and CAD s/p CABG here after a seizure.  Pt was reportedly found to be confused by his father this evening so they called EMS.  EMS found him conversant, but confused.  EMS ambulated the pt to the ambulance and put him in the stretcher and soon thereafter he reportedly vocalized loudly before having a GTC seizure.  No bowel/urinary incontinence or tongue biting.  They gave him 2.5 mg Versed IV with resolution and no further seizures.  Patient is a 52 y.o. male presenting with seizures. The history is provided by the EMS personnel.  Seizures Seizure activity on arrival: no   Seizure type:  Grand mal Episode characteristics: generalized shaking, stiffening and unresponsiveness   Postictal symptoms: confusion and somnolence   Return to baseline: no   Severity:  Mild Timing: 1 x witnessed, found confused this afternoon. Progression:  Partially resolved History of seizures: yes     Past Medical History  Diagnosis Date  . Seizures   . Mechanical heart valve present     Past Surgical History  Procedure Laterality Date  . Coronary artery bypass graft      No family history on file.  History  Substance Use Topics  . Smoking status: Never Smoker   . Smokeless tobacco: Not on file  . Alcohol Use: No      Review of Systems  Unable to perform ROS: Mental status change  Neurological: Positive for seizures.    Allergies  Review of patient's allergies indicates no known allergies.  Home Medications   Current Outpatient Rx  Name  Route  Sig  Dispense  Refill  . levETIRAcetam (KEPPRA) 500 MG tablet   Oral   Take 1 tablet (500 mg total) by mouth 2 (two) times  daily.   60 tablet   2     BP 157/96  Pulse 121  SpO2 100% Temp 98.9 RR 18 on NRB  Physical Exam  Vitals reviewed. Constitutional: He appears well-developed and well-nourished. No distress.  HENT:  Head: Normocephalic.  Right Ear: External ear normal.  Left Ear: External ear normal.  Nose: Nose normal.  Mouth/Throat: Oropharynx is clear and moist. No oropharyngeal exudate.  Eyes: Conjunctivae are normal. Pupils are equal, round, and reactive to light.  Neck: Normal range of motion. Neck supple.  Cardiovascular: Normal rate, regular rhythm, normal heart sounds and intact distal pulses.  Exam reveals no gallop and no friction rub.   No murmur heard. Pulmonary/Chest: Effort normal and breath sounds normal.  Abdominal: Soft. Bowel sounds are normal. He exhibits no distension. There is no tenderness.  Musculoskeletal: Normal range of motion. He exhibits no edema and no tenderness.  Neurological:  Will open eyes to voice Not verbalizing Not following command EOM appear intact  Skin: Skin is warm and dry.  Psychiatric: He has a normal mood and affect.    ED Course  Procedures (including critical care time)  Labs Reviewed  PROTIME-INR - Abnormal; Notable for the following:    Prothrombin Time 19.8 (*)    INR 1.75 (*)    All other components within normal limits  BASIC METABOLIC PANEL - Abnormal; Notable for the following:    CO2 15 (*)  Glucose, Bld 126 (*)    Creatinine, Ser 1.40 (*)    GFR calc non Af Amer 57 (*)    GFR calc Af Amer 66 (*)    All other components within normal limits  CBC WITH DIFFERENTIAL  URINALYSIS, ROUTINE W REFLEX MICROSCOPIC   Dg Chest 1 View  08/20/2012  *RADIOLOGY REPORT*  Clinical Data: Altered mental status.  Seizures.  CHEST - 1 VIEW  Comparison: 04/25/2005  Findings: Since the previous study, there is interval postoperative change with sternotomy wires and cardiac valve prostheses.  Shallow inspiration.  Mild cardiac enlargement with  normal pulmonary vascularity.  No focal airspace disease or consolidation in the lungs.  No blunting of costophrenic angles.  No pneumothorax. Mediastinal contours appear intact.  IMPRESSION: Postoperative changes in the mediastinum.  Shallow inspiration with mild cardiac enlargement.  No focal consolidation.   Original Report Authenticated By: Burman Nieves, M.D.    Ct Head Wo Contrast  08/20/2012  *RADIOLOGY REPORT*  Clinical Data: The history of seizures.  CT HEAD WITHOUT CONTRAST  Technique:  Contiguous axial images were obtained from the base of the skull through the vertex without contrast.  Comparison: 03/27/2012  Findings: The ventricles and sulci are symmetrical without significant effacement, displacement, or dilatation. No mass effect or midline shift. No abnormal extra-axial fluid collections. The grey-white matter junction is distinct. Basal cisterns are not effaced. No acute intracranial hemorrhage. No depressed skull fractures.  Visualized paranasal sinuses and mastoid air cells are not opacified.  Stable appearance since previous study.  IMPRESSION: No acute intracranial abnormalities.   Original Report Authenticated By: Burman Nieves, M.D.    Date: 08/20/2012  Rate: 99  Rhythm: normal sinus rhythm  QRS Axis: normal  Intervals: PR prolonged  ST/T Wave abnormalities: normal  Conduction Disutrbances:first-degree A-V block  Narrative Interpretation: First degree A-V block. When compared with ECG of 03/26/2012, no significant changes are seen.  Old EKG Reviewed: unchanged   1. Seizure       MDM   85 y M with PMH of Seizures, mechanical heart valve and CAD s/p CABG here after a seizure.  Pt was reportedly found to be confused by his father this evening so they called EMS.  EMS found him conversant, but confused.  EMS ambulated the pt to the ambulance and put him in the stretcher and soon thereafter he reportedly vocalized loudly before having a GTC seizure.  No bowel/urinary  incontinence or tongue biting.  They gave him 2.5 mg Versed IV with resolution and no further seizures.  Pt is minimally responsive on exam, but will open his eyes to voice.  Not following commands currently or answering questions.  CBC, CXR, UA, CT head, INR.  1:09 AM Pt more awake, able to tell me his name, current city and hospital he is in.  He has been loaded with 1 g Keppra and per our charts currently only takes 500 mg BID.  He is still still disoriented to other questioning.  Will plan to increase his Keppra should the patient's mental status return to baseline and he be deemed stable for discharge.  Pt care signed out to Dr. Juleen China for further monitoring.  Pt seen in conjunction with my attending, Dr. Preston Fleeting.   Oleh Genin, MD PGY-II [facility] Emergency Medicine Resident        Oleh Genin, MD 08/20/12 [phone]

## 2012-08-19 NOTE — ED Provider Notes (Signed)
52 year old male with a history of seizure disorder had a seizure at home. EMS was called and he was postictal on arrival but then had another seizure in the ambulance. He initially was unresponsive but has gradually improved level of consciousness. He has not mobile at this point but he does open his eyes and look around the room. He does not follow commands. Neurologic exam is nonfocal. Heart exam does show prominent valve click without murmur. Of note, he is on anticoagulants for a prosthetic heart valve. Therefore, CT will need to be obtained. He is not on any anticonvulsant which is amenable to checking blood levels. He'll need to be observed in the ED and given benzodiazepines as needed.   Date: 08/20/2012  Rate: 99  Rhythm: normal sinus rhythm  QRS Axis: normal  Intervals: PR prolonged  ST/T Wave abnormalities: normal  Conduction Disutrbances:first-degree A-V block   Narrative Interpretation: First degree A-V block. When compared with ECG of 03/26/2012, no significant changes are seen.  Old EKG Reviewed: unchanged    I saw and evaluated the patient, reviewed the resident's note and I agree with the findings and plan.   Dione Booze, MD 08/20/12 [phone]

## 2012-08-20 ENCOUNTER — Encounter (HOSPITAL_COMMUNITY): Payer: Self-pay | Admitting: Internal Medicine

## 2012-08-20 ENCOUNTER — Emergency Department (HOSPITAL_COMMUNITY): Payer: 59

## 2012-08-20 ENCOUNTER — Inpatient Hospital Stay (HOSPITAL_COMMUNITY): Payer: 59

## 2012-08-20 DIAGNOSIS — N19 Unspecified kidney failure: Secondary | ICD-10-CM

## 2012-08-20 DIAGNOSIS — I059 Rheumatic mitral valve disease, unspecified: Secondary | ICD-10-CM

## 2012-08-20 DIAGNOSIS — R569 Unspecified convulsions: Secondary | ICD-10-CM

## 2012-08-20 LAB — URINALYSIS, ROUTINE W REFLEX MICROSCOPIC
Ketones, ur: NEGATIVE mg/dL
Leukocytes, UA: NEGATIVE
Leukocytes, UA: NEGATIVE
Nitrite: NEGATIVE
Nitrite: NEGATIVE
Protein, ur: 30 mg/dL — AB
Specific Gravity, Urine: 1.022 (ref 1.005–1.030)
Urobilinogen, UA: 0.2 mg/dL (ref 0.0–1.0)
Urobilinogen, UA: 0.2 mg/dL (ref 0.0–1.0)
pH: 5.5 (ref 5.0–8.0)

## 2012-08-20 LAB — CBC WITH DIFFERENTIAL/PLATELET
Basophils Absolute: 0 10*3/uL (ref 0.0–0.1)
Basophils Relative: 0 % (ref 0–1)
MCHC: 35.6 g/dL (ref 30.0–36.0)
Monocytes Absolute: 0.5 10*3/uL (ref 0.1–1.0)
Neutro Abs: 6 10*3/uL (ref 1.7–7.7)
Neutrophils Relative %: 73 % (ref 43–77)
Platelets: 299 10*3/uL (ref 150–400)
RDW: 13.8 % (ref 11.5–15.5)
WBC: 8.3 10*3/uL (ref 4.0–10.5)

## 2012-08-20 LAB — COMPREHENSIVE METABOLIC PANEL
Alkaline Phosphatase: 63 U/L (ref 39–117)
BUN: 9 mg/dL (ref 6–23)
CO2: 23 mEq/L (ref 19–32)
Calcium: 9.4 mg/dL (ref 8.4–10.5)
GFR calc Af Amer: 89 mL/min — ABNORMAL LOW (ref >90)
GFR calc non Af Amer: 77 mL/min — ABNORMAL LOW (ref >90)
Glucose, Bld: 104 mg/dL — ABNORMAL HIGH (ref 70–99)
Total Protein: 7.4 g/dL (ref 6.0–8.3)

## 2012-08-20 LAB — POCT I-STAT 3, ART BLOOD GAS (G3+)
Acid-base deficit: 1 mmol/L (ref 0.0–2.0)
Bicarbonate: 23.5 mEq/L (ref 20.0–24.0)
O2 Saturation: 94 %
TCO2: 25 mmol/L (ref 0–100)
pO2, Arterial: 70 mmHg — ABNORMAL LOW (ref 80.0–100.0)

## 2012-08-20 LAB — RAPID URINE DRUG SCREEN, HOSP PERFORMED
Amphetamines: NOT DETECTED
Barbiturates: NOT DETECTED
Benzodiazepines: POSITIVE — AB

## 2012-08-20 LAB — HEPARIN LEVEL (UNFRACTIONATED): Heparin Unfractionated: 0.41 IU/mL (ref 0.30–0.70)

## 2012-08-20 LAB — URINE MICROSCOPIC-ADD ON

## 2012-08-20 LAB — BASIC METABOLIC PANEL
Calcium: 10.5 mg/dL (ref 8.4–10.5)
Creatinine, Ser: 1.4 mg/dL — ABNORMAL HIGH (ref 0.50–1.35)
GFR calc non Af Amer: 57 mL/min — ABNORMAL LOW (ref >90)
Sodium: 142 mEq/L (ref 135–145)

## 2012-08-20 LAB — RPR: RPR Ser Ql: NONREACTIVE

## 2012-08-20 LAB — GLUCOSE, CAPILLARY: Glucose-Capillary: 113 mg/dL — ABNORMAL HIGH (ref 70–99)

## 2012-08-20 LAB — LEVETIRACETAM LEVEL: Levetiracetam Lvl: <5 ug/mL — ABNORMAL LOW (ref 5.0–30.0)

## 2012-08-20 LAB — PHENYTOIN LEVEL, TOTAL: Phenytoin Lvl: <2.5 ug/mL — ABNORMAL LOW (ref 10.0–20.0)

## 2012-08-20 MED ORDER — SODIUM CHLORIDE 0.9 % IJ SOLN
3.0000 mL | Freq: Two times a day (BID) | INTRAMUSCULAR | Status: DC
  Administered 2012-08-20 – 2012-08-22 (×4): 3 mL via INTRAVENOUS

## 2012-08-20 MED ORDER — SODIUM CHLORIDE 0.9 % IV SOLN
INTRAVENOUS | Status: DC
  Administered 2012-08-20 – 2012-08-22 (×3): via INTRAVENOUS

## 2012-08-20 MED ORDER — SODIUM CHLORIDE 0.9 % IV SOLN
100.0000 mg | Freq: Two times a day (BID) | INTRAVENOUS | Status: DC
  Administered 2012-08-21: 100 mg via INTRAVENOUS
  Filled 2012-08-20 (×3): qty 10

## 2012-08-20 MED ORDER — ONDANSETRON HCL 4 MG/2ML IJ SOLN: 4.0000 mg | Freq: Four times a day (QID) | INTRAMUSCULAR | Status: DC | PRN

## 2012-08-20 MED ORDER — FOSPHENYTOIN SODIUM 500 MG PE/10ML IJ SOLN
20.0000 mg/kg | Freq: Once | INTRAMUSCULAR | Status: AC
  Administered 2012-08-20: 2006 mg via INTRAVENOUS
  Filled 2012-08-20 (×2): qty 40.12

## 2012-08-20 MED ORDER — ONDANSETRON HCL 4 MG PO TABS: 4.0000 mg | ORAL_TABLET | Freq: Four times a day (QID) | ORAL | Status: DC | PRN

## 2012-08-20 MED ORDER — LEVETIRACETAM 1000 MG PO TABS: 1000.0000 mg | ORAL_TABLET | Freq: Two times a day (BID) | ORAL | Status: DC

## 2012-08-20 MED ORDER — WARFARIN - PHARMACIST DOSING INPATIENT: Freq: Every day | Status: DC

## 2012-08-20 MED ORDER — SODIUM CHLORIDE 0.9 % IV SOLN
1000.0000 mg | Freq: Once | INTRAVENOUS | Status: AC
  Administered 2012-08-20: 1000 mg via INTRAVENOUS
  Filled 2012-08-20: qty 10

## 2012-08-20 MED ORDER — LEVETIRACETAM 500 MG PO TABS: 500.0000 mg | ORAL_TABLET | Freq: Two times a day (BID) | ORAL | Status: DC

## 2012-08-20 MED ORDER — LEVETIRACETAM 500 MG PO TABS
500.0000 mg | ORAL_TABLET | Freq: Two times a day (BID) | ORAL | Status: DC
  Administered 2012-08-20: 500 mg via ORAL
  Filled 2012-08-20 (×2): qty 1

## 2012-08-20 MED ORDER — LORAZEPAM 2 MG/ML IJ SOLN: 1.0000 mg | INTRAMUSCULAR | Status: DC | PRN

## 2012-08-20 MED ORDER — ACETAMINOPHEN 325 MG PO TABS: 650.0000 mg | ORAL_TABLET | Freq: Four times a day (QID) | ORAL | Status: DC | PRN

## 2012-08-20 MED ORDER — SODIUM CHLORIDE 0.9 % IV SOLN
200.0000 mg | Freq: Once | INTRAVENOUS | Status: AC
  Administered 2012-08-20: 200 mg via INTRAVENOUS
  Filled 2012-08-20: qty 20

## 2012-08-20 MED ORDER — WARFARIN SODIUM 7.5 MG PO TABS
7.5000 mg | ORAL_TABLET | Freq: Once | ORAL | Status: AC
  Administered 2012-08-20: 7.5 mg via ORAL
  Filled 2012-08-20: qty 1

## 2012-08-20 MED ORDER — ACETAMINOPHEN 650 MG RE SUPP: 650.0000 mg | Freq: Four times a day (QID) | RECTAL | Status: DC | PRN

## 2012-08-20 MED ORDER — SODIUM CHLORIDE 0.9 % IV SOLN
INTRAVENOUS | Status: AC
  Administered 2012-08-20: 20:00:00 via INTRAVENOUS

## 2012-08-20 MED ORDER — LEVETIRACETAM 500 MG PO TABS
1000.0000 mg | ORAL_TABLET | Freq: Two times a day (BID) | ORAL | Status: DC
  Administered 2012-08-20 – 2012-08-23 (×6): 1000 mg via ORAL
  Filled 2012-08-20 (×7): qty 2

## 2012-08-20 MED ORDER — HEPARIN (PORCINE) IN NACL 100-0.45 UNIT/ML-% IJ SOLN
1200.0000 [IU]/h | INTRAMUSCULAR | Status: DC
  Administered 2012-08-20 – 2012-08-21 (×2): 1350 [IU]/h via INTRAVENOUS
  Administered 2012-08-22: 1200 [IU]/h via INTRAVENOUS
  Filled 2012-08-20 (×5): qty 250

## 2012-08-20 NOTE — Progress Notes (Signed)
1400 came back from EEG DEPT . With EEG leads attached to pt's head connected to  EEG monitoer . PT informed

## 2012-08-20 NOTE — Progress Notes (Signed)
ANTICOAGULATION CONSULT NOTE - Initial Consult  Pharmacy Consult for Heparin Indication: h/o mechanical MVR  No Known Allergies  Patient Measurements: Height: 6' 2.02" (188 cm) Weight: 187 lb 6.3 oz (85 kg) (03/2012) IBW/kg (Calculated) : 82.24  Vital Signs: BP: 152/88 mmHg (05/02 0600) Pulse Rate: 113 (05/02 0600)  Labs:  Recent Labs  08/19/12 2330 08/19/12 2345  HGB  --  15.9  HCT  --  45.2  PLT  --  360  LABPROT 19.8*  --   INR 1.75*  --   CREATININE  --  1.40*    Estimated Creatinine Clearance: 72.6 ml/min (by C-G formula based on Cr of 1.4).   Medical History: Past Medical History  Diagnosis Date  . Seizures   . Mechanical heart valve present     Medications:  Pending clarification  Assessment: 52 yo male admitted with confusion/seizures, h/o [place] MVR and INR subtherapeutic, for heaprin  Goal of Therapy:  INR 2.5-3.5 Heparin level 0.3-0.7 Monitor platelets by anticoagulation protocol: Yes   Plan:  Start heparin 1350 units/hr Check heparin level in 8 hours. F/U home Coumadin regimen  Eddie Candle 08/20/2012,8:02 AM

## 2012-08-20 NOTE — Progress Notes (Signed)
ANTICOAGULATION CONSULT NOTE - Follow Up Consult  Pharmacy Consult for Heparin and Coumadin Indication: mechanical MVR  No Known Allergies  Patient Measurements: Height: 6\' 2"  (188 cm) Weight: 187 lb 6.3 oz (85 kg) (03/2012) IBW/kg (Calculated) : 82.2 Heparin Dosing Weight: 85 kg  Vital Signs: Temp: 98.3 F (36.8 C) (05/02 0850) Temp src: Oral (05/02 0850) BP: 135/93 mmHg (05/02 0850) Pulse Rate: 111 (05/02 0850)  Labs:  Recent Labs  08/19/12 2330 08/19/12 2345 08/20/12 0822  HGB  --  15.9  --   HCT  --  45.2  --   PLT  --  360  --   LABPROT 19.8*  --   --   INR 1.75*  --   --   CREATININE  --  1.40* 1.09    Estimated Creatinine Clearance: 93.2 ml/min (by C-G formula based on Cr of 1.09).  Assessment:  Hx mechanical MVR, patient tells me in 2002, but can't currently recall where.  IV heparin already ordered, also to continue Coumadin.  Home Coumadin dose is 5 mg daily.  Unsure when last dose taken, but INR subtherapeutic.  Patient also cannot currently recall who follows his Coumadin and when he last had his PT/INR checked, but he does relate no recent dose changes.  Goal of Therapy:  Heparin level 0.3-0.7 units/ml INR 2.5-3.5 Monitor platelets by anticoagulation protocol: Yes   Plan:   Heparin drip at 1350 units/hr as ordered.  Heparin level ~ 8 hrs after drip started.  Coumadin 7.5 mg today.  Daily heparin level, PT/INR and CBC.  Dennie Fetters, RPh Pager: [phone] 08/20/2012,11:33 AM

## 2012-08-20 NOTE — Progress Notes (Signed)
Anticoagulation Consult per Pharmacy Heparin for mechanical MVR  Heparin level = 0.41 on 1350 units/hr Goal heparin level = 0.3-0.7  Heparin level is in desired range. No changes needed. Will f/u daily heparin level and CBC.  Cardell Peach, PharmD

## 2012-08-20 NOTE — Progress Notes (Signed)
Utilization Review Completed.   Curtis Uriarte, RN, BSN Nurse Case Manager  [phone]  

## 2012-08-20 NOTE — ED Notes (Addendum)
Pt. Found standing at bedside; pt. Pleasantly confused still. When asked where he is, why he is here, he cannot tell me.  He knows his job and full name. Dr. Juleen China made aware. Continue to watch patient.

## 2012-08-20 NOTE — H&P (Signed)
[facility] History and Physical  Clifford Sullivan ZOX:[id number] DOB: February 08, 1961 DOA: 08/19/2012  Referring physician: ER physician. PCP: No PCP Per Patient   Chief Complaint: Seizures.  Most of the history was obtained from ER physician, patient's nurse and old records. Patient is confused after the seizure and unable to provide history. Unable to reach patient's family with the number provided.  HPI: Clifford Sullivan is a 52 y.o. 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 As per the history provided patient was found to be increasingly confused since last evening and since his confusion persisted EMS was called later and once EMS was bringing the patient to the hospital en route patient developed a seizure which was tonic-clonic and lasted for 45 seconds and was aborted after IV midazolam was given. In the ER patient had CT head which did not show any acute. Patient was loaded with Keppra 1 g IV. Chest x-ray and EKG was unremarkable. Patient is still confused after the seizures at this time has been admitted for further management. Patient does not provide much history as he is unable to recall most of his medical history and what medications he was taking. On exam was nonfocal alert awake and oriented to his name only. Patient otherwise denies any pain fever chills.  Review of Systems: As presented in the history of presenting illness, rest negative.  Past Medical History  Diagnosis Date  . Seizures   . Mechanical heart valve present    Past Surgical History  Procedure Laterality Date  . Coronary artery bypass graft    . Cardiac valve replacement     Social History:  reports that he has never smoked. He does not have any smokeless tobacco history on file. He reports that he does not drink alcohol or use illicit drugs. Lives with his ex father-in-law as per the report.  where does patient  live-- Not sure.  Can patient participate in ADLs?  No Known Allergies  Family History  Problem Relation Age of Onset  . Family history unknown: Yes      Prior to Admission medications   Medication Sig Start Date End Date Taking? Authorizing Provider  levETIRAcetam (KEPPRA) 1000 MG tablet Take 1 tablet (1,000 mg total) by mouth 2 (two) times daily. 08/20/12   Oleh Genin, MD   Physical Exam: Filed Vitals:   08/19/12 2311 08/20/12 0228 08/20/12 0600  BP: 157/96 135/79 152/88  Pulse: 121 88 113  Resp:   25  SpO2: 100% 100% 97%     General:  Well-developed well-nourished.   Eyes: And icteric no pallor.   ENT: No discharge from the ears eyes nose and mouth.   Neck: No mass felt.   Cardiovascular: S1-S2 heard.   Respiratory: No rhonchi or crepitations.   Abdomen: Soft nontender bowel sounds present.   Skin: No rash.   Musculoskeletal: No edema.   Psychiatric: Alert awake oriented to his name only.   Neurologic: Follows commands and moves all extremities. PEERLA positive and no facial symmetry. Tongue is midline.  Labs on Admission:  Basic Metabolic Panel:  Recent Labs Lab 08/19/12 2345  NA 142  K 4.4  CL 100  CO2 15*  GLUCOSE 126*  BUN 9  CREATININE 1.40*  CALCIUM 10.5   Liver Function Tests: No results found for this basename: AST, ALT, ALKPHOS, BILITOT, PROT, ALBUMIN,  in the last 168 hours  No results found for this basename: LIPASE, AMYLASE,  in the last 168 hours No results found for this basename: AMMONIA,  in the last 168 hours CBC:  Recent Labs Lab 08/19/12 2345  WBC 9.3  NEUTROABS 5.6  HGB 15.9  HCT 45.2  MCV 83.7  PLT 360   Cardiac Enzymes: No results found for this basename: CKTOTAL, CKMB, CKMBINDEX, TROPONINI,  in the last 168 hours  BNP (last 3 results) No results found for this basename: PROBNP,  in the last 8760 hours CBG:  Recent Labs Lab 08/19/12 2307  GLUCAP 113*    Radiological Exams on Admission: Dg Chest 1  View  08/20/2012  *RADIOLOGY REPORT*  Clinical Data: Altered mental status.  Seizures.  CHEST - 1 VIEW  Comparison: 04/25/2005  Findings: Since the previous study, there is interval postoperative change with sternotomy wires and cardiac valve prostheses.  Shallow inspiration.  Mild cardiac enlargement with normal pulmonary vascularity.  No focal airspace disease or consolidation in the lungs.  No blunting of costophrenic angles.  No pneumothorax. Mediastinal contours appear intact.  IMPRESSION: Postoperative changes in the mediastinum.  Shallow inspiration with mild cardiac enlargement.  No focal consolidation.   Original Report Authenticated By: Burman Nieves, M.D.    Ct Head Wo Contrast  08/20/2012  *RADIOLOGY REPORT*  Clinical Data: The history of seizures.  CT HEAD WITHOUT CONTRAST  Technique:  Contiguous axial images were obtained from the base of the skull through the vertex without contrast.  Comparison: 03/27/2012  Findings: The ventricles and sulci are symmetrical without significant effacement, displacement, or dilatation. No mass effect or midline shift. No abnormal extra-axial fluid collections. The grey-white matter junction is distinct. Basal cisterns are not effaced. No acute intracranial hemorrhage. No depressed skull fractures.  Visualized paranasal sinuses and mastoid air cells are not opacified.  Stable appearance since previous study.  IMPRESSION: No acute intracranial abnormalities.   Original Report Authenticated By: Burman Nieves, M.D.     EKG: Independently reviewed. Normal sinus rhythm.  Assessment/Plan Principal Problem:   Seizure Active Problems:   Mitral valve disorders   Renal failure   1. Seizures - not sure this time if patient was compliant with his medications. At this time I have discussed with on-call neurologist Dr. Amada Jupiter. Dr. Amada Jupiter is advised to get Keppra levels and EEG. Continue with Keppra 500 mg by mouth twice a day for now. They will be seeing  patient in consult. In addition we will be checking his labs including metabolic panel ammonia levels drug screen. Patient will be on when necessary Ativan for any seizure-like activities. 2. Mechanical mitral valve per the history - at this time patient's INR is subtherapeutic. I have placed patient on IV heparin and Coumadin per pharmacy.  3. Renal failure - probably chronic. Check UA. I have ordered recheck metabolic panel now. Closely follow intake output and metabolic panel. 4. Metabolic acidosis - probably secondary to seizures causing lactic acidosis. At this time patient is getting hydrated and I have reordered metabolic panel for now. Recheck anion gap.    Code Status: Full code.  Family Communication: Unable to reach the family with a number provided.  Disposition Plan: Admit to inpatient.    Jalin Erpelding N. [facility] Pager [phone].  If 7PM-7AM, please contact night-coverage [email or web address] Password [facility] 08/20/2012, 6:53 AM

## 2012-08-20 NOTE — Progress Notes (Signed)
[facility] PROGRESS NOTE  Clifford Sullivan WUJ:[id number] DOB: March 01, 1961 DOA: 08/19/2012 PCP: No PCP Per Patient  Assessment/Plan: 1-Seizure: witness by EMS. Appreciate neuro input. Continue with Keppra.  2-Confusion: could be post seizure. Check UDS, HIV, TSH, RPR.   3-Metabolic acidosis. IV fluids. ABG with normal PH. Check lactic acid. B-met. 4-Mechanical Valve mitral; IV heparin per pharmacy.  5-Renal failure. IV fluid. Repeat B-met.   Code Status: Presume Full Family Communication: none at bedside.  Disposition Plan: to be determine   Consultants:  Neuro  Procedures:  none  Antibiotics:  none  HPI/Subjective: Awake, following command. Does not record events. When as where do you live ? He said [place], but doesn't know direccion. He doesn't know about his parents. He say he was taking his medications. He is very confuse.   Objective: Filed Vitals:   08/19/12 2311 08/20/12 0228 08/20/12 0600  BP: 157/96 135/79 152/88  Pulse: 121 88 113  Resp:   25  SpO2: 100% 100% 97%   No intake or output data in the 24 hours ending 08/20/12 0800 There were no vitals filed for this visit.  Exam:   General:  Awake, following command, confuse  Cardiovascular: S1 , S 2 RRR mechanical valve clip  Respiratory: CTA  Abdomen: BS present, soft, nt  Musculoskeletal: no edema.   Data Reviewed: Basic Metabolic Panel:  Recent Labs Lab 08/19/12 2345  NA 142  K 4.4  CL 100  CO2 15*  GLUCOSE 126*  BUN 9  CREATININE 1.40*  CALCIUM 10.5   CBC:  Recent Labs Lab 08/19/12 2345  WBC 9.3  NEUTROABS 5.6  HGB 15.9  HCT 45.2  MCV 83.7  PLT 360   Cardiac Enzymes: No results found for this basename: CKTOTAL, CKMB, CKMBINDEX, TROPONINI,  in the last 168 hours BNP (last 3 results) No results found for this basename: PROBNP,  in the last 8760 hours CBG:  Recent Labs Lab 08/19/12 2307  GLUCAP 113*    No results found for this or any previous visit (from the  past 240 hour(s)).   Studies: Dg Chest 1 View  08/20/2012  *RADIOLOGY REPORT*  Clinical Data: Altered mental status.  Seizures.  CHEST - 1 VIEW  Comparison: 04/25/2005  Findings: Since the previous study, there is interval postoperative change with sternotomy wires and cardiac valve prostheses.  Shallow inspiration.  Mild cardiac enlargement with normal pulmonary vascularity.  No focal airspace disease or consolidation in the lungs.  No blunting of costophrenic angles.  No pneumothorax. Mediastinal contours appear intact.  IMPRESSION: Postoperative changes in the mediastinum.  Shallow inspiration with mild cardiac enlargement.  No focal consolidation.   Original Report Authenticated By: Burman Nieves, M.D.    Ct Head Wo Contrast  08/20/2012  *RADIOLOGY REPORT*  Clinical Data: The history of seizures.  CT HEAD WITHOUT CONTRAST  Technique:  Contiguous axial images were obtained from the base of the skull through the vertex without contrast.  Comparison: 03/27/2012  Findings: The ventricles and sulci are symmetrical without significant effacement, displacement, or dilatation. No mass effect or midline shift. No abnormal extra-axial fluid collections. The grey-white matter junction is distinct. Basal cisterns are not effaced. No acute intracranial hemorrhage. No depressed skull fractures.  Visualized paranasal sinuses and mastoid air cells are not opacified.  Stable appearance since previous study.  IMPRESSION: No acute intracranial abnormalities.   Original Report Authenticated By: Burman Nieves, M.D.     Scheduled Meds: . levETIRAcetam  500 mg Oral BID  Continuous Infusions:   Principal Problem:   Seizure Active Problems:   Mitral valve disorders   Renal failure    Time spent: 25 minutes.    Clifford Sullivan  [facility] Pager [phone]. If 7PM-7AM, please contact night-coverage at [email or web address], password [facility] 08/20/2012, 8:00 AM  LOS: 1 day

## 2012-08-20 NOTE — ED Notes (Signed)
Vital signs stable. 

## 2012-08-20 NOTE — ED Provider Notes (Signed)
Assumed care from Drs. Glick/Bryant. Pt is s/o seizure. W/u fairly unremarkable. Pt has been in ED for over 6 hours and still seems post ictal. No repeat seizure activity witnessed since arrival. Pt telling me he is [place] police station. Cannot provide correct day of week, month or year or answer many other simple questions. Pt seen in ED about a month ago for a seizure. Per review of this note, pt seemingly returned to his baseline fairly quickly.  I actually saw this patient in December when he appears to have a prolonged post-ictal state as well. Started on keppra during that admission. Was previously taking dilantin but not clear from note if stopped once keppra started.   Raeford Razor, MD 08/20/12 [phone]

## 2012-08-20 NOTE — ED Notes (Signed)
MD at bedside. Admitting physician at bedside.  

## 2012-08-20 NOTE — ED Notes (Signed)
Admitting Provider at the bedside.  

## 2012-08-20 NOTE — Progress Notes (Signed)
EEG completed.

## 2012-08-20 NOTE — Consult Note (Signed)
Reason for Consult:Seizure, confusion Referring Physician: Toniann Fail, A  CC: Seizure  History is obtained from:Patient, medical record  HPI: Clifford Sullivan is a 52 y.o. male who lives alone and was heard to be confused over the phone by his family last night. He has a history of seizures, and has had prolonged periods of confusion following seizures in the past. Currently, he states that he takes dilantin and keppra, though he mentioned gabapentin to his admitting physician.   On arrival here, he was apparently non-verbal and not following commands, but has improved considerably. He was documented to not be told to continue dilantin after an admission in December, though it is unclear to me as to what prompted this change, possibly due to the more complicated pharmacokinetics of dilantin and difficulty using it with coumadin.    LKW: yesterday, unknown time.  tpa given: no, outside of window.     ROS: A 14 point ROS was performed and is negative except as noted in the HPI.  Past Medical History  Diagnosis Date  . Seizures   . Mechanical heart valve present     Family History: No hx sz  Social History: Lives alone  Exam: Current vital signs: BP 152/88  Pulse 113  Resp 25  SpO2 97% Vital signs in last 24 hours: Pulse Rate:  [88-121] 113 (05/02 0600) Resp:  [25] 25 (05/02 0600) BP: (135-157)/(79-96) 152/88 mmHg (05/02 0600) SpO2:  [97 %-100 %] 97 % (05/02 0600)  General: in bed, nad CV: RRR Mental Status: Patient is awake, alert, oriented to person, place, but not month or year.  He follows commands, but nly with repeated prompting and appears confused.  Cranial Nerves: II: Visual Fields are full. Pupils are equal, round, and reactive to light.  Discs are difficult to visualize. . III,IV, VI: EOMI without ptosis or diploplia.  V: Facial sensation is symmetric to temperature VII: Facial movement is symmetric.  VIII: hearing is intact to voice X: Uvula elevates  symmetrically XI: Shoulder shrug is symmetric. XII: tongue is midline without atrophy or fasciculations.  Motor: Tone is normal. Bulk is normal. 5/5 strength was present in all four extremities.  Sensory: Sensation is symmetric to light touch in the arms and legs. Deep Tendon Reflexes: 2+ and symmetric in the biceps and 1+ at patellae.  Plantars: Toes are downgoing bilaterally.  Cerebellar: FNF without obvious dysmetria, but when trying to do FNF, gets confused and touches fingertips together.  Gait: Not tested 2/2 patient safety concerns.    I have reviewed labs in epic and the results pertinent to this consultation are: Dilantin < 2.5 BMP mild renal insufficiency  I have reviewed the images obtained:CT head - negative.   Impression: 52 yo F with post-ictal confusion. It is unclear to me at this time what medications the patient is actually taking, and with his confusion, this is difficult to confirm.   With low INR, compliance may be in question. He has been loaded with Keppra.   He has no focal findings to make me suspect stroke.   Recommendations: 1) EEG  2) Continue keppra 500mg  BID 3) Keppra, gabapentin levels 4) Would establish actual medications with independent source(either pharmacy or family) 5) No contraindication to IV heparin at this time(for mechanical heart valve)   Ritta Slot, MD [facility] [phone]  If 7pm- 7am, please page neurology on call at [phone].

## 2012-08-21 DIAGNOSIS — G40901 Epilepsy, unspecified, not intractable, with status epilepticus: Secondary | ICD-10-CM | POA: Diagnosis present

## 2012-08-21 LAB — CBC
HCT: 43.3 % (ref 39.0–52.0)
RBC: 5.31 MIL/uL (ref 4.22–5.81)
RDW: 13.9 % (ref 11.5–15.5)
WBC: 6.9 10*3/uL (ref 4.0–10.5)

## 2012-08-21 LAB — BASIC METABOLIC PANEL
Chloride: 107 mEq/L (ref 96–112)
Creatinine, Ser: 1.22 mg/dL (ref 0.50–1.35)
GFR calc Af Amer: 78 mL/min — ABNORMAL LOW (ref >90)
Potassium: 3.8 mEq/L (ref 3.5–5.1)
Sodium: 144 mEq/L (ref 135–145)

## 2012-08-21 LAB — PROTIME-INR
INR: 1.9 — ABNORMAL HIGH (ref 0.00–1.49)
Prothrombin Time: 21.1 seconds — ABNORMAL HIGH (ref 11.6–15.2)

## 2012-08-21 MED ORDER — CARVEDILOL 25 MG PO TABS
25.0000 mg | ORAL_TABLET | Freq: Two times a day (BID) | ORAL | Status: DC
  Filled 2012-08-21 (×3): qty 1

## 2012-08-21 MED ORDER — SODIUM CHLORIDE 0.9 % IV SOLN
250.0000 mg | Freq: Once | INTRAVENOUS | Status: AC
  Administered 2012-08-21: 250 mg via INTRAVENOUS
  Filled 2012-08-21: qty 5

## 2012-08-21 MED ORDER — HYDRALAZINE HCL 20 MG/ML IJ SOLN
10.0000 mg | Freq: Three times a day (TID) | INTRAMUSCULAR | Status: DC | PRN
  Administered 2012-08-21: 17:00:00 via INTRAVENOUS

## 2012-08-21 MED ORDER — CARVEDILOL 12.5 MG PO TABS
12.5000 mg | ORAL_TABLET | Freq: Two times a day (BID) | ORAL | Status: DC
  Administered 2012-08-21 – 2012-08-23 (×4): 12.5 mg via ORAL
  Filled 2012-08-21 (×8): qty 1

## 2012-08-21 MED ORDER — WARFARIN SODIUM 7.5 MG PO TABS
7.5000 mg | ORAL_TABLET | Freq: Once | ORAL | Status: AC
  Administered 2012-08-21: 7.5 mg via ORAL
  Filled 2012-08-21: qty 1

## 2012-08-21 MED ORDER — PHENYTOIN SODIUM 50 MG/ML IJ SOLN
100.0000 mg | Freq: Three times a day (TID) | INTRAMUSCULAR | Status: DC
  Administered 2012-08-22 – 2012-08-23 (×4): 100 mg via INTRAVENOUS
  Filled 2012-08-21 (×8): qty 2

## 2012-08-21 MED ORDER — SODIUM CHLORIDE 0.9 % IV SOLN
100.0000 mg | Freq: Two times a day (BID) | INTRAVENOUS | Status: DC
  Administered 2012-08-21 – 2012-08-22 (×2): 100 mg via INTRAVENOUS
  Filled 2012-08-21 (×4): qty 10

## 2012-08-21 MED ORDER — LORAZEPAM 2 MG/ML IJ SOLN
1.0000 mg | INTRAMUSCULAR | Status: DC | PRN
  Administered 2012-08-21: 19:00:00 via INTRAVENOUS
  Filled 2012-08-21: qty 1

## 2012-08-21 NOTE — Progress Notes (Signed)
Overnight LTVM EEG discontinued.

## 2012-08-21 NOTE — Progress Notes (Signed)
Subjective: Clifford Sullivan was noted to be in generalized status epilepticus on EEG study yesterday even though he was awake and conversant. He was kept on continuous EEG monitoring overnight. Keppra was increased to 1000 mg twice a day following a 1000 mg IV load and Vimpat 200 mg loading dose of 100 mg twice a day was added. Status epilepticus continued and he was given a loading dose of fosphenytoin which ended the status epilepticus. EEG monitoring has been discontinued. Patient has no complaints at this point. Patient indicated he was taking Lamictal and Neurontin prior to admission.  Objective: Current vital signs: BP 147/93  Pulse 97  Temp(Src) 98.7 F (37.1 C) (Oral)  Resp 18  Ht 6\' 2"  (1.88 m)  Wt 100.336 kg (221 lb 3.2 oz)  BMI 28.39 kg/m2  SpO2 98%  Neurologic Exam: Alert and in no acute distress. He is well-oriented to time as well as place. Extraocular movements were normal. Face was symmetrical with no focal weakness. Speech was normal. Moved extremities equally with good strength throughout.  Lab Results: Results for orders placed during the hospital encounter of 08/19/12 (from the past 48 hour(s))  GLUCOSE, CAPILLARY     Status: Abnormal   Collection Time    08/19/12 11:07 PM      Result Value Range   Glucose-Capillary 113 (*) 70 - 99 mg/dL   Comment 1 Notify RN     Comment 2 Documented in Chart    PROTIME-INR     Status: Abnormal   Collection Time    08/19/12 11:30 PM      Result Value Range   Prothrombin Time 19.8 (*) 11.6 - 15.2 seconds   INR 1.75 (*) 0.00 - 1.49  CBC WITH DIFFERENTIAL     Status: None   Collection Time    08/19/12 11:45 PM      Result Value Range   WBC 9.3  4.0 - 10.5 K/uL   RBC 5.40  4.22 - 5.81 MIL/uL   Hemoglobin 15.9  13.0 - 17.0 g/dL   HCT 16.1  09.6 - 04.5 %   MCV 83.7  78.0 - 100.0 fL   MCH 29.4  26.0 - 34.0 pg   MCHC 35.2  30.0 - 36.0 g/dL   RDW 40.9  81.1 - 91.4 %   Platelets 360  150 - 400 K/uL   Neutrophils Relative 60  43  - 77 %   Neutro Abs 5.6  1.7 - 7.7 K/uL   Lymphocytes Relative 34  12 - 46 %   Lymphs Abs 3.1  0.7 - 4.0 K/uL   Monocytes Relative 5  3 - 12 %   Monocytes Absolute 0.5  0.1 - 1.0 K/uL   Eosinophils Relative 0  0 - 5 %   Eosinophils Absolute 0.0  0.0 - 0.7 K/uL   Basophils Relative 0  0 - 1 %   Basophils Absolute 0.0  0.0 - 0.1 K/uL  BASIC METABOLIC PANEL     Status: Abnormal   Collection Time    08/19/12 11:45 PM      Result Value Range   Sodium 142  135 - 145 mEq/L   Potassium 4.4  3.5 - 5.1 mEq/L   Chloride 100  96 - 112 mEq/L   CO2 15 (*) 19 - 32 mEq/L   Glucose, Bld 126 (*) 70 - 99 mg/dL   BUN 9  6 - 23 mg/dL   Creatinine, Ser 7.82 (*) 0.50 - 1.35 mg/dL   Calcium  10.5  8.4 - 10.5 mg/dL   GFR calc non Af Amer 57 (*) >90 mL/min   GFR calc Af Amer 66 (*) >90 mL/min   Comment:            The eGFR has been calculated     using the CKD EPI equation.     This calculation has not been     validated in all clinical     situations.     eGFR's persistently     <90 mL/min signify     possible Chronic Kidney Disease.  LEVETIRACETAM LEVEL     Status: Abnormal   Collection Time    08/19/12 11:45 PM      Result Value Range   Levetiracetam Lvl <5.0 (*) 5.0 - 30.0 ug/mL  URINALYSIS, ROUTINE W REFLEX MICROSCOPIC     Status: Abnormal   Collection Time    08/20/12  2:47 AM      Result Value Range   Color, Urine YELLOW  YELLOW   APPearance CLOUDY (*) CLEAR   Specific Gravity, Urine 1.014  1.005 - 1.030   pH 6.0  5.0 - 8.0   Glucose, UA NEGATIVE  NEGATIVE mg/dL   Hgb urine dipstick SMALL (*) NEGATIVE   Bilirubin Urine NEGATIVE  NEGATIVE   Ketones, ur NEGATIVE  NEGATIVE mg/dL   Protein, ur 30 (*) NEGATIVE mg/dL   Urobilinogen, UA 0.2  0.0 - 1.0 mg/dL   Nitrite NEGATIVE  NEGATIVE   Leukocytes, UA NEGATIVE  NEGATIVE  URINE MICROSCOPIC-ADD ON     Status: Abnormal   Collection Time    08/20/12  2:47 AM      Result Value Range   Squamous Epithelial / LPF RARE  RARE   WBC, UA 0-2  <3  WBC/hpf   RBC / HPF 0-2  <3 RBC/hpf   Bacteria, UA RARE  RARE   Casts HYALINE CASTS (*) NEGATIVE  PHENYTOIN LEVEL, TOTAL     Status: Abnormal   Collection Time    08/20/12  5:38 AM      Result Value Range   Phenytoin Lvl <2.5 (*) 10.0 - 20.0 ug/mL  GABAPENTIN LEVEL     Status: Abnormal   Collection Time    08/20/12  7:35 AM      Result Value Range   Gabapentin Lvl <1.5 (*) 2.0 - 20.0 ug/mL  LACTIC ACID, PLASMA     Status: None   Collection Time    08/20/12  7:35 AM      Result Value Range   Lactic Acid, Venous 1.5  0.5 - 2.2 mmol/L  POCT I-STAT 3, BLOOD GAS (G3+)     Status: Abnormal   Collection Time    08/20/12  7:45 AM      Result Value Range   pH, Arterial 7.395  7.350 - 7.450   pCO2 arterial 38.3  35.0 - 45.0 mmHg   pO2, Arterial 70.0 (*) 80.0 - 100.0 mmHg   Bicarbonate 23.5  20.0 - 24.0 mEq/L   TCO2 25  0 - 100 mmol/L   O2 Saturation 94.0     Acid-base deficit 1.0  0.0 - 2.0 mmol/L   Collection site RADIAL, ALLEN'S TEST ACCEPTABLE     Drawn by Operator     Sample type ARTERIAL    TSH     Status: None   Collection Time    08/20/12  8:22 AM      Result Value Range   TSH 1.777  0.350 -  4.500 uIU/mL  RPR     Status: None   Collection Time    08/20/12  8:22 AM      Result Value Range   RPR NON REACTIVE  NON REACTIVE  HIV ANTIBODY (ROUTINE TESTING)     Status: None   Collection Time    08/20/12  8:22 AM      Result Value Range   HIV NON REACTIVE  NON REACTIVE  COMPREHENSIVE METABOLIC PANEL     Status: Abnormal   Collection Time    08/20/12  8:22 AM      Result Value Range   Sodium 141  135 - 145 mEq/L   Potassium 3.7  3.5 - 5.1 mEq/L   Chloride 105  96 - 112 mEq/L   CO2 23  19 - 32 mEq/L   Glucose, Bld 104 (*) 70 - 99 mg/dL   BUN 9  6 - 23 mg/dL   Creatinine, Ser 4.54  0.50 - 1.35 mg/dL   Calcium 9.4  8.4 - 09.8 mg/dL   Total Protein 7.4  6.0 - 8.3 g/dL   Albumin 3.7  3.5 - 5.2 g/dL   AST 18  0 - 37 U/L   ALT 11  0 - 53 U/L   Alkaline Phosphatase 63  39  - 117 U/L   Total Bilirubin 0.7  0.3 - 1.2 mg/dL   GFR calc non Af Amer 77 (*) >90 mL/min   GFR calc Af Amer 89 (*) >90 mL/min   Comment:            The eGFR has been calculated     using the CKD EPI equation.     This calculation has not been     validated in all clinical     situations.     eGFR's persistently     <90 mL/min signify     possible Chronic Kidney Disease.  AMMONIA     Status: None   Collection Time    08/20/12 10:36 AM      Result Value Range   Ammonia 22  11 - 60 umol/L  CBC WITH DIFFERENTIAL     Status: Abnormal   Collection Time    08/20/12  2:58 PM      Result Value Range   WBC 8.3  4.0 - 10.5 K/uL   RBC 4.73  4.22 - 5.81 MIL/uL   Hemoglobin 13.6  13.0 - 17.0 g/dL   HCT 11.9 (*) 14.7 - 82.9 %   MCV 80.8  78.0 - 100.0 fL   MCH 28.8  26.0 - 34.0 pg   MCHC 35.6  30.0 - 36.0 g/dL   RDW 56.2  13.0 - 86.5 %   Platelets 299  150 - 400 K/uL   Neutrophils Relative 73  43 - 77 %   Neutro Abs 6.0  1.7 - 7.7 K/uL   Lymphocytes Relative 20  12 - 46 %   Lymphs Abs 1.7  0.7 - 4.0 K/uL   Monocytes Relative 6  3 - 12 %   Monocytes Absolute 0.5  0.1 - 1.0 K/uL   Eosinophils Relative 0  0 - 5 %   Eosinophils Absolute 0.0  0.0 - 0.7 K/uL   Basophils Relative 0  0 - 1 %   Basophils Absolute 0.0  0.0 - 0.1 K/uL  HEPARIN LEVEL (UNFRACTIONATED)     Status: None   Collection Time    08/20/12  8:39 PM      Result  Value Range   Heparin Unfractionated 0.41  0.30 - 0.70 IU/mL   Comment:            IF HEPARIN RESULTS ARE BELOW     EXPECTED VALUES, AND PATIENT     DOSAGE HAS BEEN CONFIRMED,     SUGGEST FOLLOW UP TESTING     OF ANTITHROMBIN III LEVELS.  URINE RAPID DRUG SCREEN (HOSP PERFORMED)     Status: Abnormal   Collection Time    08/20/12 11:11 PM      Result Value Range   Opiates NONE DETECTED  NONE DETECTED   Cocaine NONE DETECTED  NONE DETECTED   Benzodiazepines POSITIVE (*) NONE DETECTED   Amphetamines NONE DETECTED  NONE DETECTED   Tetrahydrocannabinol NONE  DETECTED  NONE DETECTED   Barbiturates NONE DETECTED  NONE DETECTED   Comment:            DRUG SCREEN FOR MEDICAL PURPOSES     ONLY.  IF CONFIRMATION IS NEEDED     FOR ANY PURPOSE, NOTIFY LAB     WITHIN 5 DAYS.                LOWEST DETECTABLE LIMITS     FOR URINE DRUG SCREEN     Drug Class       Cutoff (ng/mL)     Amphetamine      1000     Barbiturate      200     Benzodiazepine   200     Tricyclics       300     Opiates          300     Cocaine          300     THC              50  URINALYSIS, ROUTINE W REFLEX MICROSCOPIC     Status: None   Collection Time    08/20/12 11:11 PM      Result Value Range   Color, Urine YELLOW  YELLOW   APPearance CLEAR  CLEAR   Specific Gravity, Urine 1.022  1.005 - 1.030   pH 5.5  5.0 - 8.0   Glucose, UA NEGATIVE  NEGATIVE mg/dL   Hgb urine dipstick NEGATIVE  NEGATIVE   Bilirubin Urine NEGATIVE  NEGATIVE   Ketones, ur NEGATIVE  NEGATIVE mg/dL   Protein, ur NEGATIVE  NEGATIVE mg/dL   Urobilinogen, UA 0.2  0.0 - 1.0 mg/dL   Nitrite NEGATIVE  NEGATIVE   Leukocytes, UA NEGATIVE  NEGATIVE   Comment: MICROSCOPIC NOT DONE ON URINES WITH NEGATIVE PROTEIN, BLOOD, LEUKOCYTES, NITRITE, OR GLUCOSE <1000 mg/dL.  BASIC METABOLIC PANEL     Status: Abnormal   Collection Time    08/21/12  6:50 AM      Result Value Range   Sodium 144  135 - 145 mEq/L   Potassium 3.8  3.5 - 5.1 mEq/L   Chloride 107  96 - 112 mEq/L   CO2 23  19 - 32 mEq/L   Glucose, Bld 97  70 - 99 mg/dL   BUN 12  6 - 23 mg/dL   Creatinine, Ser 1.30  0.50 - 1.35 mg/dL   Calcium 9.7  8.4 - 86.5 mg/dL   GFR calc non Af Amer 67 (*) >90 mL/min   GFR calc Af Amer 78 (*) >90 mL/min   Comment:            The eGFR has  been calculated     using the CKD EPI equation.     This calculation has not been     validated in all clinical     situations.     eGFR's persistently     <90 mL/min signify     possible Chronic Kidney Disease.  CBC     Status: Abnormal   Collection Time    08/21/12   6:50 AM      Result Value Range   WBC 6.9  4.0 - 10.5 K/uL   RBC 5.31  4.22 - 5.81 MIL/uL   Hemoglobin 15.7  13.0 - 17.0 g/dL   HCT 46.9  62.9 - 52.8 %   MCV 81.5  78.0 - 100.0 fL   MCH 29.6  26.0 - 34.0 pg   MCHC 36.3 (*) 30.0 - 36.0 g/dL   RDW 41.3  24.4 - 01.0 %   Platelets 310  150 - 400 K/uL  HEPARIN LEVEL (UNFRACTIONATED)     Status: Abnormal   Collection Time    08/21/12  6:50 AM      Result Value Range   Heparin Unfractionated 0.91 (*) 0.30 - 0.70 IU/mL   Comment:            IF HEPARIN RESULTS ARE BELOW     EXPECTED VALUES, AND PATIENT     DOSAGE HAS BEEN CONFIRMED,     SUGGEST FOLLOW UP TESTING     OF ANTITHROMBIN III LEVELS.  PROTIME-INR     Status: Abnormal   Collection Time    08/21/12  6:50 AM      Result Value Range   Prothrombin Time 21.1 (*) 11.6 - 15.2 seconds   INR 1.90 (*) 0.00 - 1.49    Studies/Results: Dg Chest 1 View  08/20/2012  *RADIOLOGY REPORT*  Clinical Data: Altered mental status.  Seizures.  CHEST - 1 VIEW  Comparison: 04/25/2005  Findings: Since the previous study, there is interval postoperative change with sternotomy wires and cardiac valve prostheses.  Shallow inspiration.  Mild cardiac enlargement with normal pulmonary vascularity.  No focal airspace disease or consolidation in the lungs.  No blunting of costophrenic angles.  No pneumothorax. Mediastinal contours appear intact.  IMPRESSION: Postoperative changes in the mediastinum.  Shallow inspiration with mild cardiac enlargement.  No focal consolidation.   Original Report Authenticated By: Burman Nieves, M.D.    Ct Head Wo Contrast  08/20/2012  *RADIOLOGY REPORT*  Clinical Data: The history of seizures.  CT HEAD WITHOUT CONTRAST  Technique:  Contiguous axial images were obtained from the base of the skull through the vertex without contrast.  Comparison: 03/27/2012  Findings: The ventricles and sulci are symmetrical without significant effacement, displacement, or dilatation. No mass effect or  midline shift. No abnormal extra-axial fluid collections. The grey-white matter junction is distinct. Basal cisterns are not effaced. No acute intracranial hemorrhage. No depressed skull fractures.  Visualized paranasal sinuses and mastoid air cells are not opacified.  Stable appearance since previous study.  IMPRESSION: No acute intracranial abnormalities.   Original Report Authenticated By: Burman Nieves, M.D.     Medications: I have reviewed the patient's current medications.  Assessment and plan: Status epilepticus, controlled with current combination of Keppra 1000 mg twice a day, Vimpat 100 mg twice a day and Dilantin per pharmacy management.  No changes in current management recommended except for discontinuing Vimpat. Patient will need followup with neurology at the [facility] in [place], [place].  C.R. Roseanne Reno, MD [facility] [phone]  08/21/2012  11:03 AM

## 2012-08-21 NOTE — Progress Notes (Signed)
MEDICATION RELATED CONSULT NOTE - INITIAL   Pharmacy Consult for Dilantin + heparin Indication: Seizures  + mechanical MVR and AVR   No Known Allergies  Patient Measurements: Height: 6\' 2"  (188 cm) Weight: 221 lb 3.2 oz (100.336 kg) (per RN) IBW/kg (Calculated) : 82.2 Adjusted Body Weight:   Vital Signs: Temp: 98.2 F (36.8 C) (05/03 1500) Temp src: Oral (05/03 1500) BP: 177/104 mmHg (05/03 1500) Pulse Rate: 89 (05/03 1500) Intake/Output from previous day: 05/02 0701 - 05/03 0700 In: 802.5 [P.O.:340; I.V.:417.5; IV Piggyback:45] Out: 850 [Urine:850] Intake/Output from this shift: Total I/O In: 340 [P.O.:340] Out: -   Labs:  Recent Labs  08/19/12 2345 08/20/12 0822 08/20/12 1458 08/21/12 0650  WBC 9.3  --  8.3 6.9  HGB 15.9  --  13.6 15.7  HCT 45.2  --  38.2* 43.3  PLT 360  --  299 310  CREATININE 1.40* 1.09  --  1.22  ALBUMIN  --  3.7  --   --   PROT  --  7.4  --   --   AST  --  18  --   --   ALT  --  11  --   --   ALKPHOS  --  63  --   --   BILITOT  --  0.7  --   --    Estimated Creatinine Clearance: 90.6 ml/min (by C-G formula based on Cr of 1.22).   Microbiology: No results found for this or any previous visit (from the past 720 hour(s)).  Medical History: Past Medical History  Diagnosis Date  . Seizures   . Mechanical heart valve present     Assessment:  Anticoagulation: Mechanical AVR/MVR. Recheck heparin level now within goal range at 0.42.  Dilantin: Hx sz d/o, seizures 5/1 pm (missed doses pta?) -- Keppra 1gm IV load in ED, Home keppra 500 BID resumed, then incr 1gm BID by neuro. -- Neuro also ordered Keppra, Gabapentin levels, both undetectable. Dilantin level <2.5 (not on pta); Ativan IV prn.  -- Fosphenytoin 20 mg/kg given 5/2 23:46,  -- Vimpat added (200 x 1, 100 IV BID).  -- Abnormal EEG 5/2, c/w status epilepticus  Goal of Therapy:  Heparin level 0.3-.0.7 Dilantin level 10-20 adjusted for albumin  Plan:  Dilantin 300 mg hs.  Check level in about 5-7 days at steady state.  Continue heparin at 1200 units/hr and recheck level in am.   Clifford Sullivan, PharmD, BCPS Clinical Staff Pharmacist Pager [phone]  Misty Stanley Stillinger 08/21/2012,4:30 PM

## 2012-08-21 NOTE — Progress Notes (Signed)
[facility] PROGRESS NOTE  Clifford Sullivan:[id number] DOB: 27-Dec-1960 DOA: 08/19/2012 PCP: Pola Corn, MD  Assessment/Plan: 1-Seizure: witness by EMS. Patient had EEG with pattern consistent for continues status epilepticus. Keppra was increase to 1000 mg BID. Patient received a dose of fosphenytoin. Patient also started on Vimpat. Appreciate neurologist help.  2-Confusion: probably related to  seizure.  UDS only positive for benzos, HIV negative, TSH, RPR nrgative. Less confuse today but probably not at baseline.    3-Metabolic acidosis. IV fluids. ABG with normal PH. lactic acid normal. Resolved.  4-Mechanical Valve mitral; IV heparin per pharmacy. Coumadin per pharmacy to dose.  5-Acute Renal failure. Resolved with  IV fluid.   Code Status: Presume Full Family Communication: none at bedside.  Disposition Plan: to be determine   Consultants:  Neuro  Procedures:  none  Antibiotics:  none  HPI/Subjective: Patient thought that I work with him at [employer]. He then was able to tell me that he was at [facility], that he had a seizure. That he take coumadin and others medications. He does relates that he forgot to take his medications on Tuesday.   Objective: Filed Vitals:   08/20/12 1450 08/20/12 2210 08/21/12 0225 08/21/12 0702  BP: 174/103 131/83 136/85 147/93  Pulse: 84 82 79 97  Temp: 97.5 F (36.4 C) 97.6 F (36.4 C) 97.9 F (36.6 C) 98.7 F (37.1 C)  TempSrc: Oral Oral Oral Oral  Resp: 17 18 18 18   Height:      Weight:      SpO2: 97% 97% 95% 98%    Intake/Output Summary (Last 24 hours) at 08/21/12 0825 Last data filed at 08/21/12 0700  Gross per 24 hour  Intake 802.45 ml  Output    850 ml  Net -47.55 ml   Filed Weights   08/20/12 0700 08/20/12 0850  Weight: 85 kg (187 lb 6.3 oz) 100.336 kg (221 lb 3.2 oz)    Exam:   General:  Awake, following command,  Cardiovascular: S1 , S 2 RRR mechanical valve clip  Respiratory:  CTA  Abdomen: BS present, soft, nt  Musculoskeletal: no edema.   Neuro exam: non focal.   Data Reviewed: Basic Metabolic Panel:  Recent Labs Lab 08/19/12 2345 08/20/12 0822 08/21/12 0650  NA 142 141 144  K 4.4 3.7 3.8  CL 100 105 107  CO2 15* 23 23  GLUCOSE 126* 104* 97  BUN 9 9 12   CREATININE 1.40* 1.09 1.22  CALCIUM 10.5 9.4 9.7   CBC:  Recent Labs Lab 08/19/12 2345 08/20/12 1458 08/21/12 0650  WBC 9.3 8.3 6.9  NEUTROABS 5.6 6.0  --   HGB 15.9 13.6 15.7  HCT 45.2 38.2* 43.3  MCV 83.7 80.8 81.5  PLT 360 299 310   Cardiac Enzymes: No results found for this basename: CKTOTAL, CKMB, CKMBINDEX, TROPONINI,  in the last 168 hours BNP (last 3 results) No results found for this basename: PROBNP,  in the last 8760 hours CBG:  Recent Labs Lab 08/19/12 2307  GLUCAP 113*    No results found for this or any previous visit (from the past 240 hour(s)).   Studies: Dg Chest 1 View  08/20/2012  *RADIOLOGY REPORT*  Clinical Data: Altered mental status.  Seizures.  CHEST - 1 VIEW  Comparison: 04/25/2005  Findings: Since the previous study, there is interval postoperative change with sternotomy wires and cardiac valve prostheses.  Shallow inspiration.  Mild cardiac enlargement with normal pulmonary vascularity.  No focal  airspace disease or consolidation in the lungs.  No blunting of costophrenic angles.  No pneumothorax. Mediastinal contours appear intact.  IMPRESSION: Postoperative changes in the mediastinum.  Shallow inspiration with mild cardiac enlargement.  No focal consolidation.   Original Report Authenticated By: Burman Nieves, M.D.    Ct Head Wo Contrast  08/20/2012  *RADIOLOGY REPORT*  Clinical Data: The history of seizures.  CT HEAD WITHOUT CONTRAST  Technique:  Contiguous axial images were obtained from the base of the skull through the vertex without contrast.  Comparison: 03/27/2012  Findings: The ventricles and sulci are symmetrical without significant  effacement, displacement, or dilatation. No mass effect or midline shift. No abnormal extra-axial fluid collections. The grey-white matter junction is distinct. Basal cisterns are not effaced. No acute intracranial hemorrhage. No depressed skull fractures.  Visualized paranasal sinuses and mastoid air cells are not opacified.  Stable appearance since previous study.  IMPRESSION: No acute intracranial abnormalities.   Original Report Authenticated By: Burman Nieves, M.D.     Scheduled Meds: . lacosamide (VIMPAT) IV  100 mg Intravenous Q12H  . levETIRAcetam  1,000 mg Oral BID  . sodium chloride  3 mL Intravenous Q12H  . Warfarin - Pharmacist Dosing Inpatient   Does not apply q1800   Continuous Infusions: . sodium chloride 50 mL/hr at 08/20/12 2352  . sodium chloride 50 mL/hr at 08/20/12 0900  . heparin 1,350 Units/hr (08/21/12 0410)    Principal Problem:   Seizure Active Problems:   Mitral valve disorders   Renal failure    Time spent: 25 minutes.    Aleisha Paone  [facility] Pager [phone]. If 7PM-7AM, please contact night-coverage at [email or web address], password [facility] 08/21/2012, 8:25 AM  LOS: 2 days

## 2012-08-21 NOTE — Procedures (Signed)
EEG NUMBER:  14 - [id number].  REFERRING PHYSICIAN:  Eduard Clos, MD  INDICATION FOR STUDY:  A 52 year old man with a history of seizure disorder, who presented with recurrent and prolonged confusion. Recurrent seizure activity was suspected.  The study is being performed to rule out ongoing seizure activity.  DESCRIPTION:  This is a routine EEG recording performed during wakefulness.  The patient was noted to be slightly confused at time of the study.  This EEG recording consists of continuous generalized seizure activity with 2.5-3 hertz spike and slow wave discharges, as well as multi spike-slow wave complexes.  Photic stimulation was not performed.  Hyperventilation was not performed.  There were no areas of disproportionate focal slowing.  INTERPRETATION:  This EEG is abnormal with a pattern consistent with generalized continuous status epilepticus.     Noel Christmas, MD    [id number] D:  08/20/2012 15:46:56  T:  08/21/2012 03:11:35  Job #:  [id number]

## 2012-08-21 NOTE — Progress Notes (Signed)
ANTICOAGULATION CONSULT NOTE - Follow Up Consult  Pharmacy Consult for Heparin and Coumadin Indication: mechanical MVR and AVR  No Known Allergies  Patient Measurements: Height: 6\' 2"  (188 cm) Weight: 221 lb 3.2 oz (100.336 kg) (per RN) IBW/kg (Calculated) : 82.2 Heparin Dosing Weight: 85 kg  Vital Signs: Temp: 98.7 F (37.1 C) (05/03 0702) Temp src: Oral (05/03 0702) BP: 147/93 mmHg (05/03 0702) Pulse Rate: 97 (05/03 0702)  Labs:  Recent Labs  08/19/12 2330  08/19/12 2345 08/20/12 0822 08/20/12 1458 08/20/12 2039 08/21/12 0650  HGB  --   < > 15.9  --  13.6  --  15.7  HCT  --   --  45.2  --  38.2*  --  43.3  PLT  --   --  360  --  299  --  310  LABPROT 19.8*  --   --   --   --   --  21.1*  INR 1.75*  --   --   --   --   --  1.90*  HEPARINUNFRC  --   --   --   --   --  0.41 0.91*  CREATININE  --   --  1.40* 1.09  --   --  1.22  < > = values in this interval not displayed.  Estimated Creatinine Clearance: 90.6 ml/min (by C-G formula based on Cr of 1.22).  Assessment:  Hx mechanical MVR and also AVR, per patient today. Confused yesterday, and he told me surgery was in 2002 but he didn't recall where. Today he told me in 2012 at the [place].  Home Coumadin dose is 5 mg daily. INR subtherapeutic on admission, last taken 4/30 per patient today He thinks his INR was most recently 2.1. Managed by [facility]     Heparin drip begun 5/2 at 1350 units/hr.  Level was therapeutic last night, but now supratherapeutic. INR 1.75->1.90 after Coumadin 7.5 mg on 5/2.     Requested to complete complete medication history.  History obtained 5/2, but unable to obtain more than Keppra and Coumadin at that time, as far as Rx's filled at [facility] and [facility].   Got through to [facility] today for more complete list.  Reviewed list with patient, and electronic list updated.  Meds include: Gabapentin 300 mg TID, Carvedilol 12.5 mg BID,  Warfarin 5 mg daily, B-12 1000 mcg daily,  Thiamine 100 mg daily, Mag Oxide 420 mg daily (not taking), Trazodone 50-100 mg qhs prn sleep (not taking), and of note, Keppra 500 mg BID, last filled 4/29, but patient reports that it was re-filled in error, and that he was no longer taking Keppra.  He relates last doses of meds on Wed, 4/30.  Keppra and Gabapentin levels undetectable.  UDS positive for benzodiazepines 5/2 pm, Versed 2.5 mg IV given by EMS 5/1 pm. No benzodiazepines on home med list.  Goal of Therapy:  Heparin level 0.3-0.7 units/ml INR 2.5-3.5 Monitor platelets by anticoagulation protocol: Yes   Plan:   Heparin drip at 1350 units/hr as ordered.  Heparin level ~ 8 hrs after drip started.  Coumadin 7.5 mg today.  Daily heparin level, PT/INR and CBC.   Discussed new home med information with Dr. Sunnie Nielsen.  Dennie Fetters, RPh Pager: [phone] 08/21/2012,9:53 AM

## 2012-08-22 DIAGNOSIS — G40401 Other generalized epilepsy and epileptic syndromes, not intractable, with status epilepticus: Principal | ICD-10-CM

## 2012-08-22 LAB — CBC
MCH: 29.6 pg (ref 26.0–34.0)
MCV: 81.1 fL (ref 78.0–100.0)
Platelets: 281 10*3/uL (ref 150–400)
RDW: 14 % (ref 11.5–15.5)
WBC: 5.9 10*3/uL (ref 4.0–10.5)

## 2012-08-22 MED ORDER — WARFARIN SODIUM 2.5 MG PO TABS
2.5000 mg | ORAL_TABLET | Freq: Once | ORAL | Status: AC
  Administered 2012-08-22: 2.5 mg via ORAL
  Filled 2012-08-22: qty 1

## 2012-08-22 MED ORDER — PHENYTOIN SODIUM EXTENDED 100 MG PO CAPS
300.0000 mg | ORAL_CAPSULE | Freq: Every day | ORAL | Status: DC
  Filled 2012-08-22: qty 3

## 2012-08-22 MED ORDER — LACOSAMIDE 50 MG PO TABS
100.0000 mg | ORAL_TABLET | Freq: Two times a day (BID) | ORAL | Status: DC
  Administered 2012-08-23 (×2): 100 mg via ORAL
  Filled 2012-08-22 (×3): qty 2

## 2012-08-22 NOTE — Progress Notes (Signed)
Subjective: Patient is ready to go home but has no other complaints. Episode of apparent confusion yesterday evening thought to likely represent recurrent seizure activity. He has not been started on maintenance Dilantin. He was given 250 mg IV of Dilantin and Vimpat was continued at 100 mg every 12 hours. Dilantin was continued at 300 mg daily.  Objective: Current vital signs: BP 157/92  Pulse 83  Temp(Src) 98.3 F (36.8 C) (Oral)  Resp 20  Ht 6\' 2"  (1.88 m)  Wt 100.6 kg (221 lb 12.5 oz)  BMI 28.46 kg/m2  SpO2 97%  Neurologic Exam: Alert and in no acute distress. Mental status was normal. Patient denied being confused at any point yesterday but admits to being somewhat upset with lab technicians. Speech was normal. Moved extremities well with equal strength.  Lab Results: Results for orders placed during the hospital encounter of 08/19/12 (from the past 48 hour(s))  CBC WITH DIFFERENTIAL     Status: Abnormal   Collection Time    08/20/12  2:58 PM      Result Value Range   WBC 8.3  4.0 - 10.5 K/uL   RBC 4.73  4.22 - 5.81 MIL/uL   Hemoglobin 13.6  13.0 - 17.0 g/dL   HCT 16.1 (*) 09.6 - 04.5 %   MCV 80.8  78.0 - 100.0 fL   MCH 28.8  26.0 - 34.0 pg   MCHC 35.6  30.0 - 36.0 g/dL   RDW 40.9  81.1 - 91.4 %   Platelets 299  150 - 400 K/uL   Neutrophils Relative 73  43 - 77 %   Neutro Abs 6.0  1.7 - 7.7 K/uL   Lymphocytes Relative 20  12 - 46 %   Lymphs Abs 1.7  0.7 - 4.0 K/uL   Monocytes Relative 6  3 - 12 %   Monocytes Absolute 0.5  0.1 - 1.0 K/uL   Eosinophils Relative 0  0 - 5 %   Eosinophils Absolute 0.0  0.0 - 0.7 K/uL   Basophils Relative 0  0 - 1 %   Basophils Absolute 0.0  0.0 - 0.1 K/uL  HEPARIN LEVEL (UNFRACTIONATED)     Status: None   Collection Time    08/20/12  8:39 PM      Result Value Range   Heparin Unfractionated 0.41  0.30 - 0.70 IU/mL   Comment:            IF HEPARIN RESULTS ARE BELOW     EXPECTED VALUES, AND PATIENT     DOSAGE HAS BEEN CONFIRMED,   SUGGEST FOLLOW UP TESTING     OF ANTITHROMBIN III LEVELS.  URINE RAPID DRUG SCREEN (HOSP PERFORMED)     Status: Abnormal   Collection Time    08/20/12 11:11 PM      Result Value Range   Opiates NONE DETECTED  NONE DETECTED   Cocaine NONE DETECTED  NONE DETECTED   Benzodiazepines POSITIVE (*) NONE DETECTED   Amphetamines NONE DETECTED  NONE DETECTED   Tetrahydrocannabinol NONE DETECTED  NONE DETECTED   Barbiturates NONE DETECTED  NONE DETECTED   Comment:            DRUG SCREEN FOR MEDICAL PURPOSES     ONLY.  IF CONFIRMATION IS NEEDED     FOR ANY PURPOSE, NOTIFY LAB     WITHIN 5 DAYS.                LOWEST DETECTABLE LIMITS     FOR  URINE DRUG SCREEN     Drug Class       Cutoff (ng/mL)     Amphetamine      1000     Barbiturate      200     Benzodiazepine   200     Tricyclics       300     Opiates          300     Cocaine          300     THC              50  URINALYSIS, ROUTINE W REFLEX MICROSCOPIC     Status: None   Collection Time    08/20/12 11:11 PM      Result Value Range   Color, Urine YELLOW  YELLOW   APPearance CLEAR  CLEAR   Specific Gravity, Urine 1.022  1.005 - 1.030   pH 5.5  5.0 - 8.0   Glucose, UA NEGATIVE  NEGATIVE mg/dL   Hgb urine dipstick NEGATIVE  NEGATIVE   Bilirubin Urine NEGATIVE  NEGATIVE   Ketones, ur NEGATIVE  NEGATIVE mg/dL   Protein, ur NEGATIVE  NEGATIVE mg/dL   Urobilinogen, UA 0.2  0.0 - 1.0 mg/dL   Nitrite NEGATIVE  NEGATIVE   Leukocytes, UA NEGATIVE  NEGATIVE   Comment: MICROSCOPIC NOT DONE ON URINES WITH NEGATIVE PROTEIN, BLOOD, LEUKOCYTES, NITRITE, OR GLUCOSE <1000 mg/dL.  BASIC METABOLIC PANEL     Status: Abnormal   Collection Time    08/21/12  6:50 AM      Result Value Range   Sodium 144  135 - 145 mEq/L   Potassium 3.8  3.5 - 5.1 mEq/L   Chloride 107  96 - 112 mEq/L   CO2 23  19 - 32 mEq/L   Glucose, Bld 97  70 - 99 mg/dL   BUN 12  6 - 23 mg/dL   Creatinine, Ser 1.61  0.50 - 1.35 mg/dL   Calcium 9.7  8.4 - 09.6 mg/dL   GFR  calc non Af Amer 67 (*) >90 mL/min   GFR calc Af Amer 78 (*) >90 mL/min   Comment:            The eGFR has been calculated     using the CKD EPI equation.     This calculation has not been     validated in all clinical     situations.     eGFR's persistently     <90 mL/min signify     possible Chronic Kidney Disease.  CBC     Status: Abnormal   Collection Time    08/21/12  6:50 AM      Result Value Range   WBC 6.9  4.0 - 10.5 K/uL   RBC 5.31  4.22 - 5.81 MIL/uL   Hemoglobin 15.7  13.0 - 17.0 g/dL   HCT 04.5  40.9 - 81.1 %   MCV 81.5  78.0 - 100.0 fL   MCH 29.6  26.0 - 34.0 pg   MCHC 36.3 (*) 30.0 - 36.0 g/dL   RDW 91.4  78.2 - 95.6 %   Platelets 310  150 - 400 K/uL  HEPARIN LEVEL (UNFRACTIONATED)     Status: Abnormal   Collection Time    08/21/12  6:50 AM      Result Value Range   Heparin Unfractionated 0.91 (*) 0.30 - 0.70 IU/mL   Comment:  IF HEPARIN RESULTS ARE BELOW     EXPECTED VALUES, AND PATIENT     DOSAGE HAS BEEN CONFIRMED,     SUGGEST FOLLOW UP TESTING     OF ANTITHROMBIN III LEVELS.  PROTIME-INR     Status: Abnormal   Collection Time    08/21/12  6:50 AM      Result Value Range   Prothrombin Time 21.1 (*) 11.6 - 15.2 seconds   INR 1.90 (*) 0.00 - 1.49  HEPARIN LEVEL (UNFRACTIONATED)     Status: None   Collection Time    08/21/12  3:22 PM      Result Value Range   Heparin Unfractionated 0.42  0.30 - 0.70 IU/mL   Comment:            IF HEPARIN RESULTS ARE BELOW     EXPECTED VALUES, AND PATIENT     DOSAGE HAS BEEN CONFIRMED,     SUGGEST FOLLOW UP TESTING     OF ANTITHROMBIN III LEVELS.  HEPARIN LEVEL (UNFRACTIONATED)     Status: None   Collection Time    08/22/12  5:30 AM      Result Value Range   Heparin Unfractionated 0.50  0.30 - 0.70 IU/mL   Comment:            IF HEPARIN RESULTS ARE BELOW     EXPECTED VALUES, AND PATIENT     DOSAGE HAS BEEN CONFIRMED,     SUGGEST FOLLOW UP TESTING     OF ANTITHROMBIN III LEVELS.  CBC     Status:  Abnormal   Collection Time    08/22/12  5:30 AM      Result Value Range   WBC 5.9  4.0 - 10.5 K/uL   RBC 4.77  4.22 - 5.81 MIL/uL   Hemoglobin 14.1  13.0 - 17.0 g/dL   HCT 21.3 (*) 08.6 - 57.8 %   MCV 81.1  78.0 - 100.0 fL   MCH 29.6  26.0 - 34.0 pg   MCHC 36.4 (*) 30.0 - 36.0 g/dL   RDW 46.9  62.9 - 52.8 %   Platelets 281  150 - 400 K/uL  PROTIME-INR     Status: Abnormal   Collection Time    08/22/12  5:30 AM      Result Value Range   Prothrombin Time 28.8 (*) 11.6 - 15.2 seconds   INR 2.90 (*) 0.00 - 1.49  PHENYTOIN LEVEL, TOTAL     Status: Abnormal   Collection Time    08/22/12  7:40 AM      Result Value Range   Phenytoin Lvl 22.3 (*) 10.0 - 20.0 ug/mL    Studies/Results: No results found.  Medications: I have reviewed the patient's current medications.  Assessment/Plan: Patient's transient change in personality with apparent confusion may well benefit manifestation of recurrent seizure activity. There was no EEG recording at the time. His prolonged EEG showed generalized status epilepticus, even though he was awake and conversant, although confused. He is doing well at this point with no clinical indications of seizure activity currently.  Recommend continuing Keppra at 1000 mg twice a day, Dilantin 300 mg per day and Vimpat 100 mg twice a day. No objection to discharge in the a.m. if he remains stable. He is to continue routine followup at the [facility] in [place].  C.R. Roseanne Reno, MD [facility] [phone]  08/22/2012  12:45 PM

## 2012-08-22 NOTE — Progress Notes (Signed)
[facility] PROGRESS NOTE  Clifford Sullivan BJY:[id number] DOB: 10-Apr-1961 DOA: 08/19/2012 PCP: Pola Corn, MD  Assessment/Plan: 1-Seizure: witness by EMS. Patient had EEG with pattern consistent for continues status epilepticus. Keppra was increase to 1000 mg BID. Patient received a dose of fosphenytoin. Patient restarted  on Vimpat 5-3 after confusion/hallucination episode. Continue with dilantin 300 mg at bed time.  Appreciate neurologist help.   2-Confusion: Probably related to seizure.  UDS only positive for benzos, HIV negative, TSH, RPR nrgative. Patient is more appropriate today.  3-Metabolic acidosis. IV fluids. ABG with normal PH. lactic acid normal. Resolved.  4-Mechanical Valve mitral; Coumadin per pharmacy to dose. Discontinue heparin, INR 2.9. 5-Acute Renal failure. Resolved with  IV fluid.   Code Status: Presume Full Family Communication: none at bedside.  Disposition Plan: to be determine   Consultants:  Neuro  Procedures:  none  Antibiotics:  none  HPI/Subjective: Patient more appropriate today. Answering question. Oriented to person and place. No visual hallucination today. He denies suicidal thought or thought of harming others.   Objective: Filed Vitals:   08/21/12 0702 08/21/12 1500 08/21/12 2013 08/22/12 0442  BP: 147/93 177/104 138/87 157/92  Pulse: 97 89 76 83  Temp: 98.7 F (37.1 C) 98.2 F (36.8 C) 99.1 F (37.3 C) 98.3 F (36.8 C)  TempSrc: Oral Oral Oral Oral  Resp: 18 19 20 20   Height:      Weight:    100.6 kg (221 lb 12.5 oz)  SpO2: 98% 98% 97% 97%    Intake/Output Summary (Last 24 hours) at 08/22/12 1321 Last data filed at 08/22/12 1056  Gross per 24 hour  Intake   1533 ml  Output   3650 ml  Net  -2117 ml   Filed Weights   08/20/12 0700 08/20/12 0850 08/22/12 0442  Weight: 85 kg (187 lb 6.3 oz) 100.336 kg (221 lb 3.2 oz) 100.6 kg (221 lb 12.5 oz)    Exam:   General:  Awake, following command,  Cardiovascular: S1 ,  S 2 RRR mechanical valve clip  Respiratory: CTA  Abdomen: BS present, soft, nt  Musculoskeletal: no edema.   Neuro exam: non focal.   Data Reviewed: Basic Metabolic Panel:  Recent Labs Lab 08/19/12 2345 08/20/12 0822 08/21/12 0650  NA 142 141 144  K 4.4 3.7 3.8  CL 100 105 107  CO2 15* 23 23  GLUCOSE 126* 104* 97  BUN 9 9 12   CREATININE 1.40* 1.09 1.22  CALCIUM 10.5 9.4 9.7   CBC:  Recent Labs Lab 08/19/12 2345 08/20/12 1458 08/21/12 0650 08/22/12 0530  WBC 9.3 8.3 6.9 5.9  NEUTROABS 5.6 6.0  --   --   HGB 15.9 13.6 15.7 14.1  HCT 45.2 38.2* 43.3 38.7*  MCV 83.7 80.8 81.5 81.1  PLT 360 299 310 281   Cardiac Enzymes: No results found for this basename: CKTOTAL, CKMB, CKMBINDEX, TROPONINI,  in the last 168 hours BNP (last 3 results) No results found for this basename: PROBNP,  in the last 8760 hours CBG:  Recent Labs Lab 08/19/12 2307  GLUCAP 113*    No results found for this or any previous visit (from the past 240 hour(s)).   Studies: No results found.  Scheduled Meds: . carvedilol  12.5 mg Oral BID WC  . lacosamide  100 mg Oral BID  . levETIRAcetam  1,000 mg Oral BID  . [START ON 08/23/2012] phenytoin  300 mg Oral QHS  . phenytoin (DILANTIN) IV  100 mg  Intravenous Q8H  . sodium chloride  3 mL Intravenous Q12H  . warfarin  2.5 mg Oral ONCE-1800  . Warfarin - Pharmacist Dosing Inpatient   Does not apply q1800   Continuous Infusions: . sodium chloride 50 mL/hr at 08/21/12 1700    Principal Problem:   Seizure Active Problems:   Mitral valve disorders   Renal failure   Status epilepticus    Time spent: 25 minutes.    [name],[name]  [facility] Pager [phone]. If 7PM-7AM, please contact night-coverage at [email or web address], password [facility] 08/22/2012, 1:21 PM  LOS: 3 days

## 2012-08-22 NOTE — Progress Notes (Addendum)
ANTICOAGULATION & DILANTIN CONSULT NOTE - Follow Up Consult  Pharmacy Consult for Heparin and Coumadin, Dilantin Indication: mechanical MVR and AVR, seizure disorder  No Known Allergies  Patient Measurements: Height: 6\' 2"  (188 cm) Weight: 221 lb 12.5 oz (100.6 kg) IBW/kg (Calculated) : 82.2 Heparin Dosing Weight: 85 kg  Vital Signs: Temp: 98.3 F (36.8 C) (05/04 0442) Temp src: Oral (05/04 0442) BP: 157/92 mmHg (05/04 0442) Pulse Rate: 83 (05/04 0442)  Labs:  Recent Labs  08/19/12 2330  08/19/12 2345 08/20/12 0822 08/20/12 1458  08/21/12 0650 08/21/12 1522 08/22/12 0530  HGB  --   < > 15.9  --  13.6  --  15.7  --  14.1  HCT  --   < > 45.2  --  38.2*  --  43.3  --  38.7*  PLT  --   < > 360  --  299  --  310  --  281  LABPROT 19.8*  --   --   --   --   --  21.1*  --  28.8*  INR 1.75*  --   --   --   --   --  1.90*  --  2.90*  HEPARINUNFRC  --   --   --   --   --   < > 0.91* 0.42 0.50  CREATININE  --   --  1.40* 1.09  --   --  1.22  --   --   < > = values in this interval not displayed.  Estimated Creatinine Clearance: 90.8 ml/min (by C-G formula based on Cr of 1.22).  Assessment:  Hx mechanical MVR and also AVR in 2012 at the [facility] per patient. Home Coumadin dose was 5 mg daily. INR subtherapeutic on admission (1.75), last taken 4/30 per patient today He thinks his INR was most recently 2.1. Managed by [facility].   INR now up to 2.90 after 7.5 mg daily x 2. At goal, but expect more rise by am. Heparin level is therapeutic on 1200 units/hr.  Fosphenytoin load given on admit 5/1 pm, Keppra resumed at 500 mg BID (though was off it just prior to admission), and increased to 1000 mg BID on 5/2. Keppra and Gabapentin levels undectable on admission. Currently off Gabapentin.  Vimpat added 5/2 pm and currently on 100 mg IV BID.  Dilantin added 5/3 pm with 250 mg IV x 2, then 100 mg IV q8hrs. Dilantin level today is 22.3 mcg/ml, drawn just a few hours after am  IV dose. Albumin = 3.7.  Discussed briefly with Dr. Roseanne Reno.  Vimpat to continue, ok to change to PO.  Will also change Dilantin to PO, and begin 300 mg PO QHS on 5/5.  FYI: I think he mixed up Lamictal with Levetiracetam. No Lamictal on home med list obtained from the [place] yesterday.  Goal of Therapy:  Heparin level 0.3-0.7 units/ml INR 2.5-3.5 Monitor platelets by anticoagulation protocol: Yes Dilantin levels 10-20 mcg/ml   Plan:   Discontinue heparin today?  Coumadin 2.5 mg today.  May be able to resume 5 mg daily tomorrow. Daily PT/INR.   Daily heparin level and CBC while on Heparin.  Steady state Dilantin level in 4-6 days.  Dennie Fetters, [place] Pager: [phone] 08/22/2012,12:11 PM  Addendum:   Sherron Monday briefly with Dr. Sunnie Nielsen.  OK to discontinue heparin drip.  T.Quenesha Douglass, RPh 08/22/2012 12:55 PM

## 2012-08-23 LAB — PROTIME-INR: Prothrombin Time: 36 seconds — ABNORMAL HIGH (ref 11.6–15.2)

## 2012-08-23 MED ORDER — WARFARIN SODIUM 2.5 MG PO TABS: 2.5000 mg | ORAL_TABLET | Freq: Every day | ORAL | Status: DC

## 2012-08-23 MED ORDER — LACOSAMIDE 100 MG PO TABS: 100.0000 mg | ORAL_TABLET | Freq: Two times a day (BID) | ORAL | Status: AC

## 2012-08-23 MED ORDER — WARFARIN SODIUM 2.5 MG PO TABS: ORAL_TABLET | ORAL | Status: AC

## 2012-08-23 MED ORDER — PHENYTOIN SODIUM EXTENDED 300 MG PO CAPS: 300.0000 mg | ORAL_CAPSULE | Freq: Every day | ORAL | Status: AC

## 2012-08-23 MED ORDER — LEVETIRACETAM 1000 MG PO TABS: 1000.0000 mg | ORAL_TABLET | Freq: Two times a day (BID) | ORAL | Status: AC

## 2012-08-23 NOTE — Discharge Summary (Signed)
Physician Discharge Summary  Clifford Sullivan ZOX:[id number] DOB: 1960-05-18 DOA: 08/19/2012  PCP: Pola Corn, MD  Admit date: 08/19/2012 Discharge date: 08/23/2012  Time spent:  35 minutes  Recommendations for Outpatient Follow-up:  1. [facility].  2. Needs INR in 24 to 48 hours.   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    Mitral valve disorders   Renal failure   Status epilepticus   Discharge Condition: Stable.   Diet recommendation: Heart Healthy.  Filed Weights   08/20/12 0700 08/20/12 0850 08/22/12 0442  Weight: 85 kg (187 lb 6.3 oz) 100.336 kg (221 lb 3.2 oz) 100.6 kg (221 lb 12.5 oz)    History of present illness:  Clifford Sullivan is a 52 y.o. 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 As per the history provided patient was found to be increasingly confused since last evening and since his confusion persisted EMS was called later and once EMS was bringing the patient to the hospital en route patient developed a seizure which was tonic-clonic and lasted for 45 seconds and was aborted after IV midazolam was given. In the ER patient had CT head which did not show any acute. Patient was loaded with Keppra 1 g IV. Chest x-ray and EKG was unremarkable. Patient is still confused after the seizures at this time has been admitted for further management. Patient does not provide much history as he is unable to recall most of his medical history and what medications he was taking. On exam was nonfocal alert awake and oriented to his name only. Patient otherwise denies any pain fever chills   Hospital Course:  52 year old with past medical history significant for mechanical mitral valve  on Coumadin, seizure disorder, was brought to the emergency department after a seizure episode. Patient was found to  be very confused the evening prior to admission, she had a witnessed  tonic-clonic seizure by EMS. he received a dose of Midazolam. Patient was loaded with Keppra in the emergency department. Patient continued to be confused. He had overnight EEG that show abnormal  pattern consistent with generalized continuous status epilepticus. He was at that time started on IV vimpat and received a dose of fosphenytoin. S subsequently IV Vimpat was stopped. After that patient developed another confusion episode, and had some visual hallucination. For these reasons Vimpat was resume. The morning of discharge patient is oriented x3, oriented to situation. Denies these weren't seen nation. He will be discharged on Keppra, Dilantin and Vimpat. He would need to have an INR check in 24 hours.    1-Seizure: witness by EMS. Patient had EEG with pattern consistent for continues status epilepticus. Keppra was increase to 1000 mg BID. Patient received a dose of fosphenytoin. Patient restarted on Vimpat 5-3 after confusion/hallucination episode. Continue with dilantin 300 mg at bed time. Appreciate neurologist help.  2-Confusion: Probably related to seizure. UDS only positive for benzos, HIV negative, TSH, RPR nrgative. Patient is  appropriate today.  3-Metabolic acidosis. IV fluids. ABG with normal PH. lactic acid normal. Resolved.  4-Mechanical Valve mitral; Coumadin per pharmacy to dose. Discontinue heparin, INR 2.9. He will be discharge on coumadin 2.5 mg tonight then 5 mg daily.  5-Acute Renal failure. Resolved with IV fluid.    Procedures: 5/3 EEG: This EEG is abnormal  with a pattern consistent with  generalized continuous status epilepticus.  Consultations:  Dr Roseanne Reno.  Discharge Exam: Filed Vitals:   08/22/12 1334 08/22/12 2145 08/23/12 0233 08/23/12 0750  BP: 103/65 112/70 115/64 119/71  Pulse: 73 69 72 70  Temp: 98.4 F (36.9 C) 98.5 F (36.9 C) 98 F (36.7 C) 97.5 F (36.4 C)  TempSrc: Oral Oral  Oral Oral  Resp: 20 18 18 17   Height:      Weight:      SpO2: 97% 98% 97% 96%    General: No distress, oriented to person, time place, situation Cardiovascular: S 1, S 2 RRR, Mechanical valve clip  Respiratory: CTA Neuro: Non focal.   Discharge Instructions  Discharge Orders   Future Orders Complete By Expires     Diet - low sodium heart healthy  As directed     Increase activity slowly  As directed         Medication List    STOP taking these medications       gabapentin 300 MG capsule  Commonly known as:  NEURONTIN      TAKE these medications       carvedilol 25 MG tablet  Commonly known as:  COREG  Take 12.5 mg by mouth 2 (two) times daily with a meal.     Lacosamide 100 MG Tabs  Take 1 tablet (100 mg total) by mouth 2 (two) times daily.     levETIRAcetam 1000 MG tablet  Commonly known as:  KEPPRA  Take 1 tablet (1,000 mg total) by mouth 2 (two) times daily.     levETIRAcetam 1000 MG tablet  Commonly known as:  KEPPRA  Take 1 tablet (1,000 mg total) by mouth 2 (two) times daily.     phenytoin 300 MG ER capsule  Commonly known as:  DILANTIN  Take 1 capsule (300 mg total) by mouth at bedtime.     thiamine 100 MG tablet  Take 100 mg by mouth daily.     vitamin B-12 1000 MCG tablet  Commonly known as:  CYANOCOBALAMIN  Take 1,000 mcg by mouth daily.     warfarin 2.5 MG tablet  Commonly known as:  COUMADIN  Take 1 tablet (2.5 mg total) tonight them 1 tablet daily.        No Known Allergies     Follow-up Information   Schedule an appointment as soon as possible for a visit with your Neurologist and regular doctor..       The results of significant diagnostics from this hospitalization (including imaging, microbiology, ancillary and laboratory) are listed below for reference.    Significant Diagnostic Studies: Dg Chest 1 View  08/20/2012  *RADIOLOGY REPORT*  Clinical Data: Altered mental status.  Seizures.  CHEST - 1 VIEW  Comparison: 04/25/2005   Findings: Since the previous study, there is interval postoperative change with sternotomy wires and cardiac valve prostheses.  Shallow inspiration.  Mild cardiac enlargement with normal pulmonary vascularity.  No focal airspace disease or consolidation in the lungs.  No blunting of costophrenic angles.  No pneumothorax. Mediastinal contours appear intact.  IMPRESSION: Postoperative changes in the mediastinum.  Shallow inspiration with mild cardiac enlargement.  No focal consolidation.   Original Report Authenticated By: Burman Nieves, M.D.    Ct Head Wo Contrast  08/20/2012  *RADIOLOGY REPORT*  Clinical Data: The history of seizures.  CT HEAD WITHOUT CONTRAST  Technique:  Contiguous axial images were obtained from the base of the skull through the vertex  without contrast.  Comparison: 03/27/2012  Findings: The ventricles and sulci are symmetrical without significant effacement, displacement, or dilatation. No mass effect or midline shift. No abnormal extra-axial fluid collections. The grey-white matter junction is distinct. Basal cisterns are not effaced. No acute intracranial hemorrhage. No depressed skull fractures.  Visualized paranasal sinuses and mastoid air cells are not opacified.  Stable appearance since previous study.  IMPRESSION: No acute intracranial abnormalities.   Original Report Authenticated By: Burman Nieves, M.D.     Microbiology: No results found for this or any previous visit (from the past 240 hour(s)).   Labs: Basic Metabolic Panel:  Recent Labs Lab 08/19/12 2345 08/20/12 0822 08/21/12 0650  NA 142 141 144  K 4.4 3.7 3.8  CL 100 105 107  CO2 15* 23 23  GLUCOSE 126* 104* 97  BUN 9 9 12   CREATININE 1.40* 1.09 1.22  CALCIUM 10.5 9.4 9.7   Liver Function Tests:  Recent Labs Lab 08/20/12 0822  AST 18  ALT 11  ALKPHOS 63  BILITOT 0.7  PROT 7.4  ALBUMIN 3.7   No results found for this basename: LIPASE, AMYLASE,  in the last 168 hours  Recent Labs Lab  08/20/12 1036  AMMONIA 22   CBC:  Recent Labs Lab 08/19/12 2345 08/20/12 1458 08/21/12 0650 08/22/12 0530  WBC 9.3 8.3 6.9 5.9  NEUTROABS 5.6 6.0  --   --   HGB 15.9 13.6 15.7 14.1  HCT 45.2 38.2* 43.3 38.7*  MCV 83.7 80.8 81.5 81.1  PLT 360 299 310 281   Cardiac Enzymes: No results found for this basename: CKTOTAL, CKMB, CKMBINDEX, TROPONINI,  in the last 168 hours BNP: BNP (last 3 results) No results found for this basename: PROBNP,  in the last 8760 hours CBG:  Recent Labs Lab 08/19/12 2307  GLUCAP 113*       Signed:  Jordi Kamm  [facility] 08/23/2012, 8:00 AM

## 2012-08-23 NOTE — Progress Notes (Signed)
Subjective: Recurrence of seizure activity reported. He said no recurrent periods of confusion. Patient has no complaints at this point.  Objective: Current vital signs: BP 119/71  Pulse 70  Temp(Src) 97.5 F (36.4 C) (Oral)  Resp 17  Ht 6\' 2"  (1.88 m)  Wt 100.6 kg (221 lb 12.5 oz)  BMI 28.46 kg/m2  SpO2 96%  Neurologic Exam: Alert and in no acute distress. Mental status was normal. Speech was normal.  Lab Results: Results for orders placed during the hospital encounter of 08/19/12 (from the past 48 hour(s))  HEPARIN LEVEL (UNFRACTIONATED)     Status: None   Collection Time    08/21/12  3:22 PM      Result Value Range   Heparin Unfractionated 0.42  0.30 - 0.70 IU/mL   Comment:            IF HEPARIN RESULTS ARE BELOW     EXPECTED VALUES, AND PATIENT     DOSAGE HAS BEEN CONFIRMED,     SUGGEST FOLLOW UP TESTING     OF ANTITHROMBIN III LEVELS.  HEPARIN LEVEL (UNFRACTIONATED)     Status: None   Collection Time    08/22/12  5:30 AM      Result Value Range   Heparin Unfractionated 0.50  0.30 - 0.70 IU/mL   Comment:            IF HEPARIN RESULTS ARE BELOW     EXPECTED VALUES, AND PATIENT     DOSAGE HAS BEEN CONFIRMED,     SUGGEST FOLLOW UP TESTING     OF ANTITHROMBIN III LEVELS.  CBC     Status: Abnormal   Collection Time    08/22/12  5:30 AM      Result Value Range   WBC 5.9  4.0 - 10.5 K/uL   RBC 4.77  4.22 - 5.81 MIL/uL   Hemoglobin 14.1  13.0 - 17.0 g/dL   HCT 96.0 (*) 45.4 - 09.8 %   MCV 81.1  78.0 - 100.0 fL   MCH 29.6  26.0 - 34.0 pg   MCHC 36.4 (*) 30.0 - 36.0 g/dL   RDW 11.9  14.7 - 82.9 %   Platelets 281  150 - 400 K/uL  PROTIME-INR     Status: Abnormal   Collection Time    08/22/12  5:30 AM      Result Value Range   Prothrombin Time 28.8 (*) 11.6 - 15.2 seconds   INR 2.90 (*) 0.00 - 1.49  PHENYTOIN LEVEL, TOTAL     Status: Abnormal   Collection Time    08/22/12  7:40 AM      Result Value Range   Phenytoin Lvl 22.3 (*) 10.0 - 20.0 ug/mL   PROTIME-INR     Status: Abnormal   Collection Time    08/23/12  5:00 AM      Result Value Range   Prothrombin Time 36.0 (*) 11.6 - 15.2 seconds   INR 3.91 (*) 0.00 - 1.49    Studies/Results: No results found.  Medications: I have reviewed the patient's current medications.  Assessment/Plan: Seizure disorder with recurrent seizures presenting with status epilepticus, with clinical and EEG findings consistent with spike wave stupor, in addition to tonic-clonic seizure activity prior to admission. Patient is doing well at this point and seizures are controlled on combination of Dilantin 300 mg per day, Keppra 1000 mg twice a day and Vimpat 100 mg twice a day.  Recommend no changes in current management. Followup at  the [facility] in [place].  C.R. Roseanne Reno, MD [facility] [phone]  08/23/2012  9:36 AM

## 2012-08-23 NOTE — Progress Notes (Signed)
08/23/12 1000 In to speak with pt. about home health services.  Gave pt. list of home health agencies, and pt. chose [facility].  Pt. normally goes to the [facility] to get his blood drawn.  This NCM called the [place] [phone]) to make aware of pt. discharge today, needing a follow-up appointment.  A nurse from the [place] will be calling pt. tomorrow to set up follow up appointment.  NCM made the pt. aware of follow up.  TC to Debbie, with [facility], to give referral for [facility] RN for disease management.  Pt. to dc home today.  Tera Mater, RN, BSN NCM [phone]

## 2012-08-23 NOTE — Progress Notes (Signed)
1100 discharge instructions  And prescription given to pt verbalized understanding. wheed to lobby by volunteer with family member

## 2013-06-25 IMAGING — CT CT HEAD W/O CM
1 of 2 series · 16 of 30 positions shown, 20 images · non-contrast
Comparison: 03/27/2012

CLINICAL DATA: The history of seizures.

CT HEAD WITHOUT CONTRAST
TECHNIQUE: Contiguous axial images were obtained from the base of
the skull through the vertex without contrast.

[Series 3: recon 2: brain · axial · 0.49mm/px · z∈[+129,+270]mm · 16 of 64 slices shown, 20 images]
[im 4/64  brain]
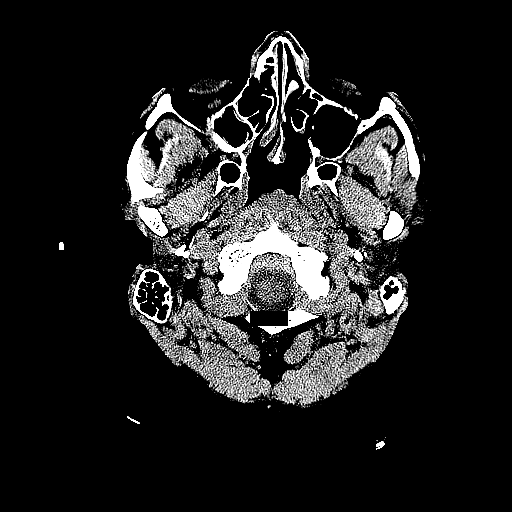
[im 4/64  bone]
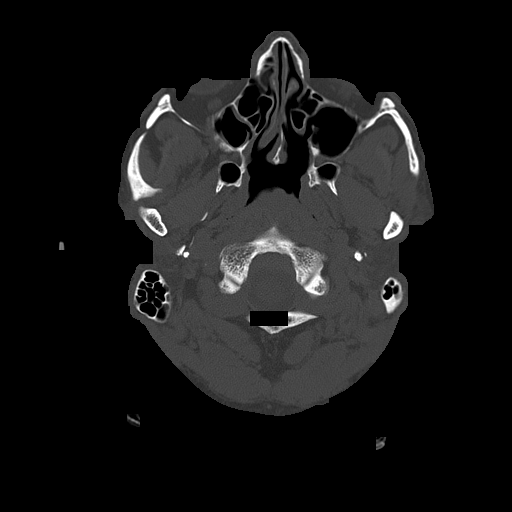
[im 7/64  brain]
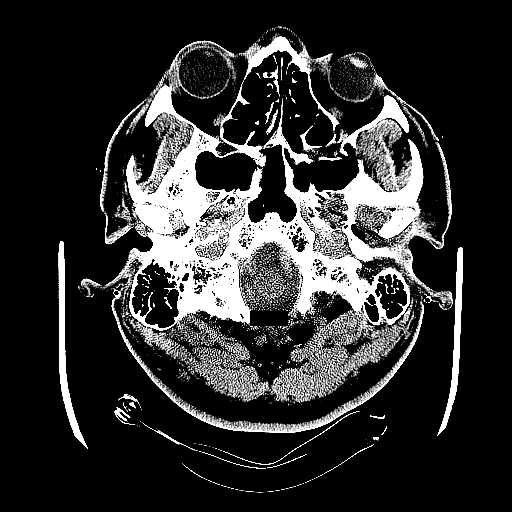
[im 10/64  brain]
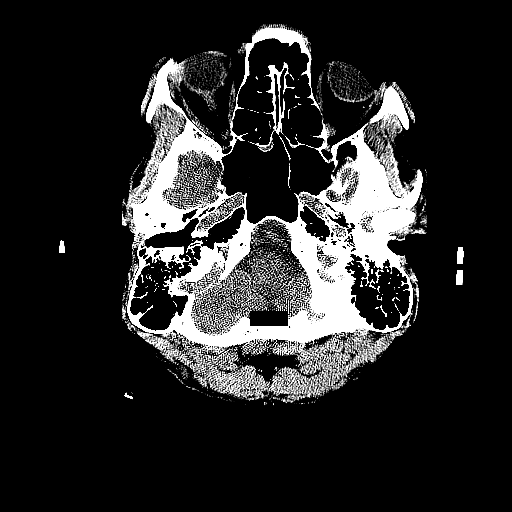
[im 14/64  brain]
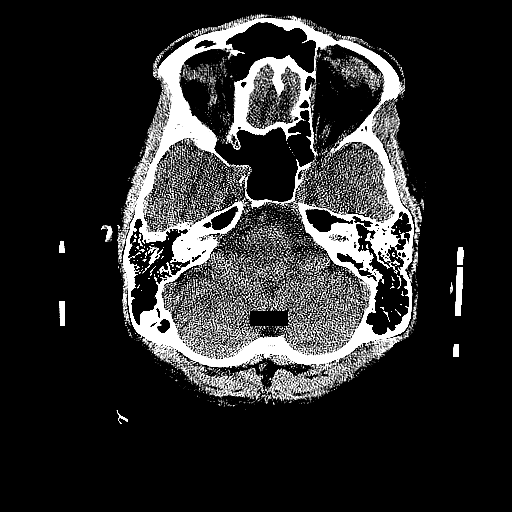
[im 20/64  brain]
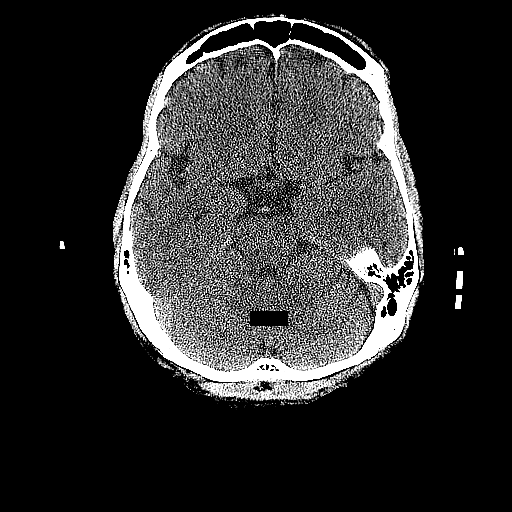
[im 20/64  bone]
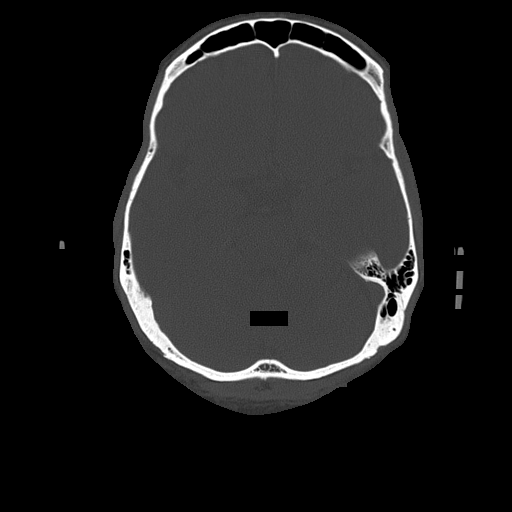
[im 24/64  brain]
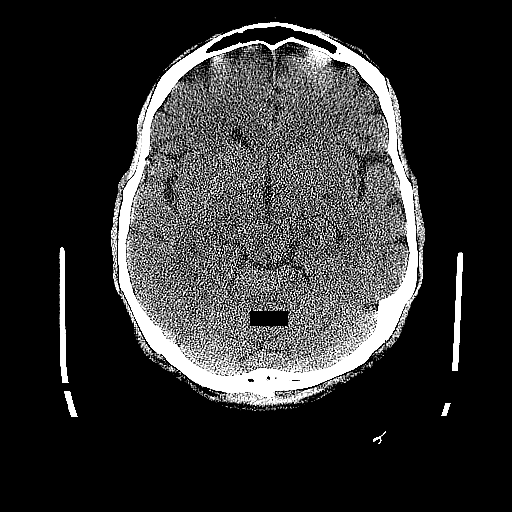
[im 27/64  brain]
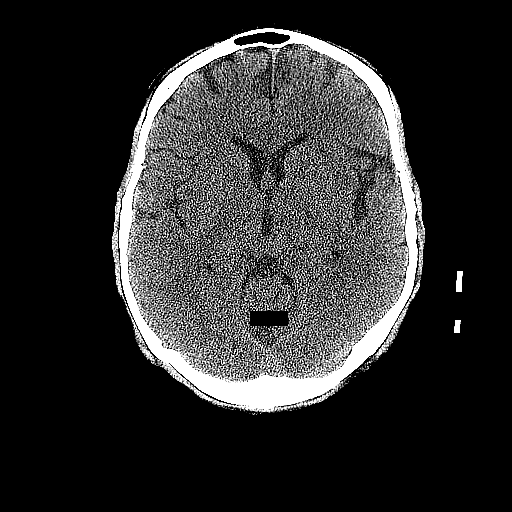
[im 30/64  brain]
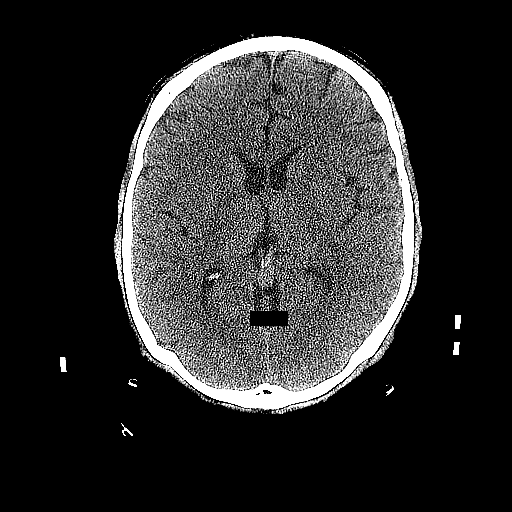
[im 34/64  brain]
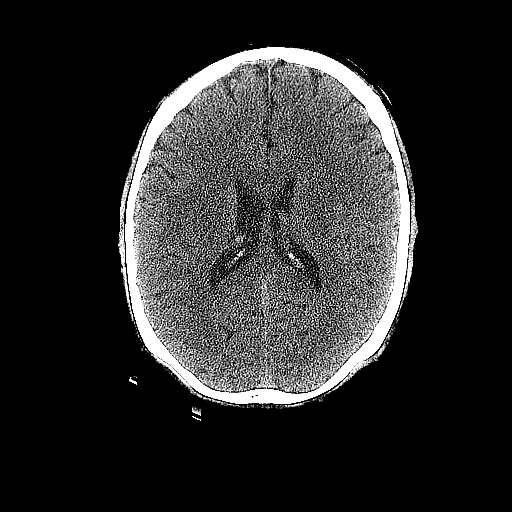
[im 34/64  bone]
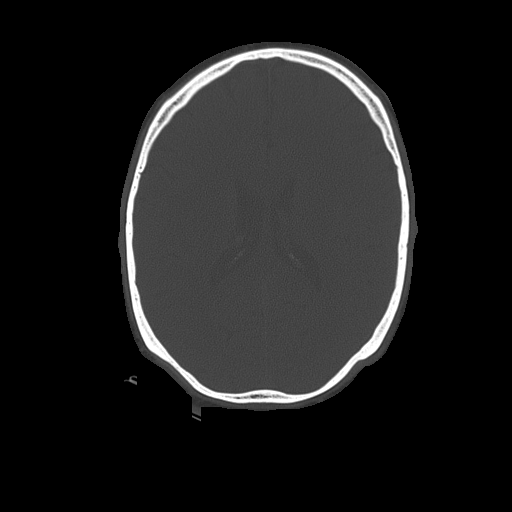
[im 37/64  brain]
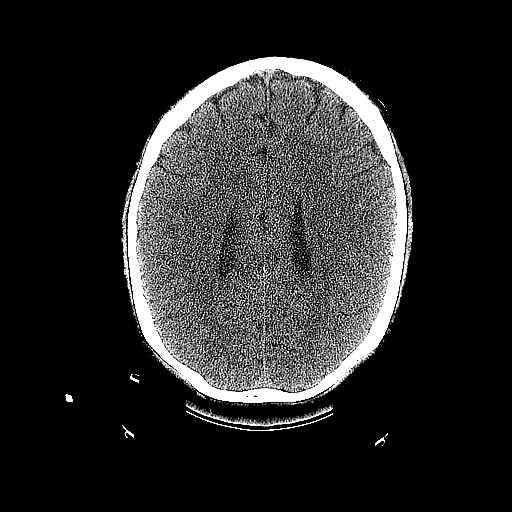
[im 40/64  brain]
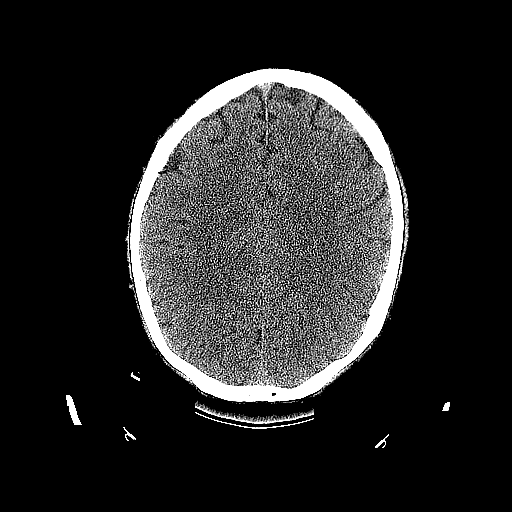
[im 44/64  brain]
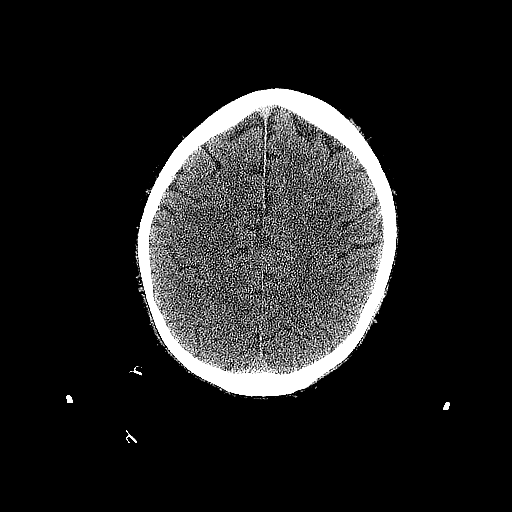
[im 50/64  brain]
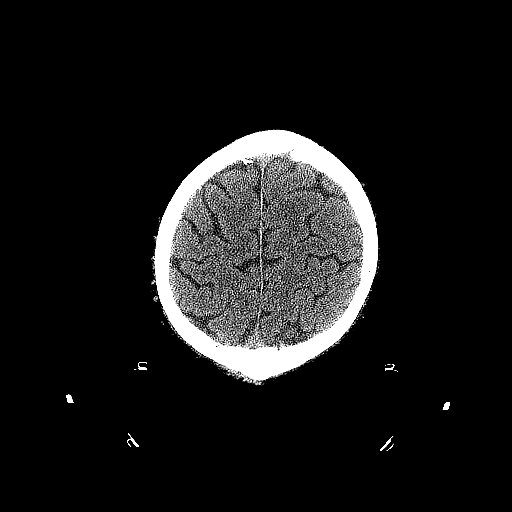
[im 50/64  bone]
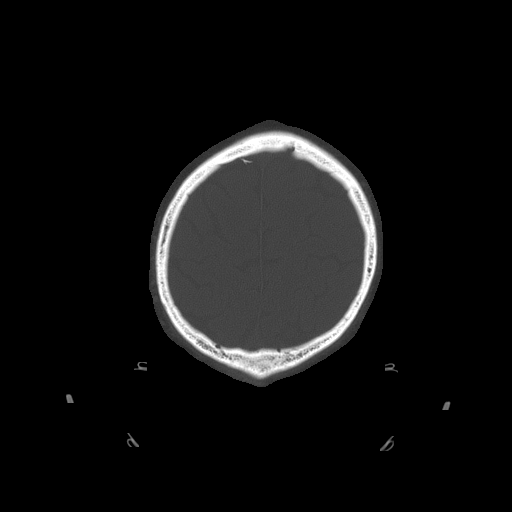
[im 54/64  brain]
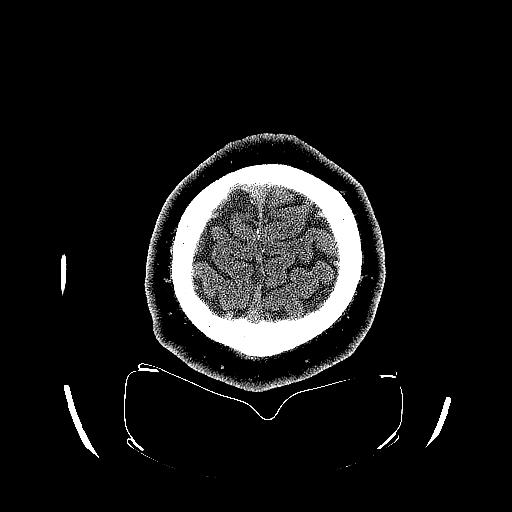
[im 57/64  brain]
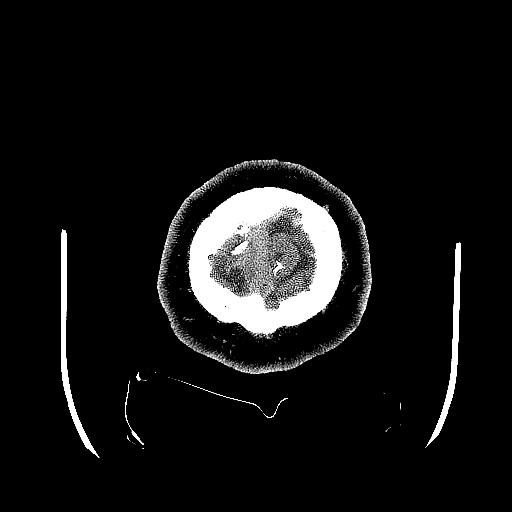
[im 60/64  brain]
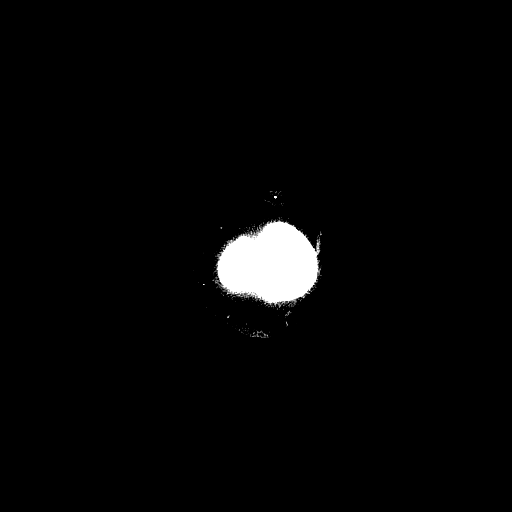

[16 of 30 positions shown; findings below may reference images not displayed]

FINDINGS: The ventricles and sulci are symmetrical without
significant effacement, displacement, or dilatation. No mass effect
or midline shift. No abnormal extra-axial fluid collections. The
grey-white matter junction is distinct. Basal cisterns are not
effaced. No acute intracranial hemorrhage. No depressed skull
fractures.  Visualized paranasal sinuses and mastoid air cells are
not opacified.  Stable appearance since previous study.
IMPRESSION: No acute intracranial abnormalities.

## 2013-06-26 IMAGING — CR DG CHEST 1V
1 series · 1 of 1 positions shown · non-contrast
Comparison: 04/25/2005

CLINICAL DATA: Altered mental status.  Seizures.

CHEST - 1 VIEW

[x chest ap]
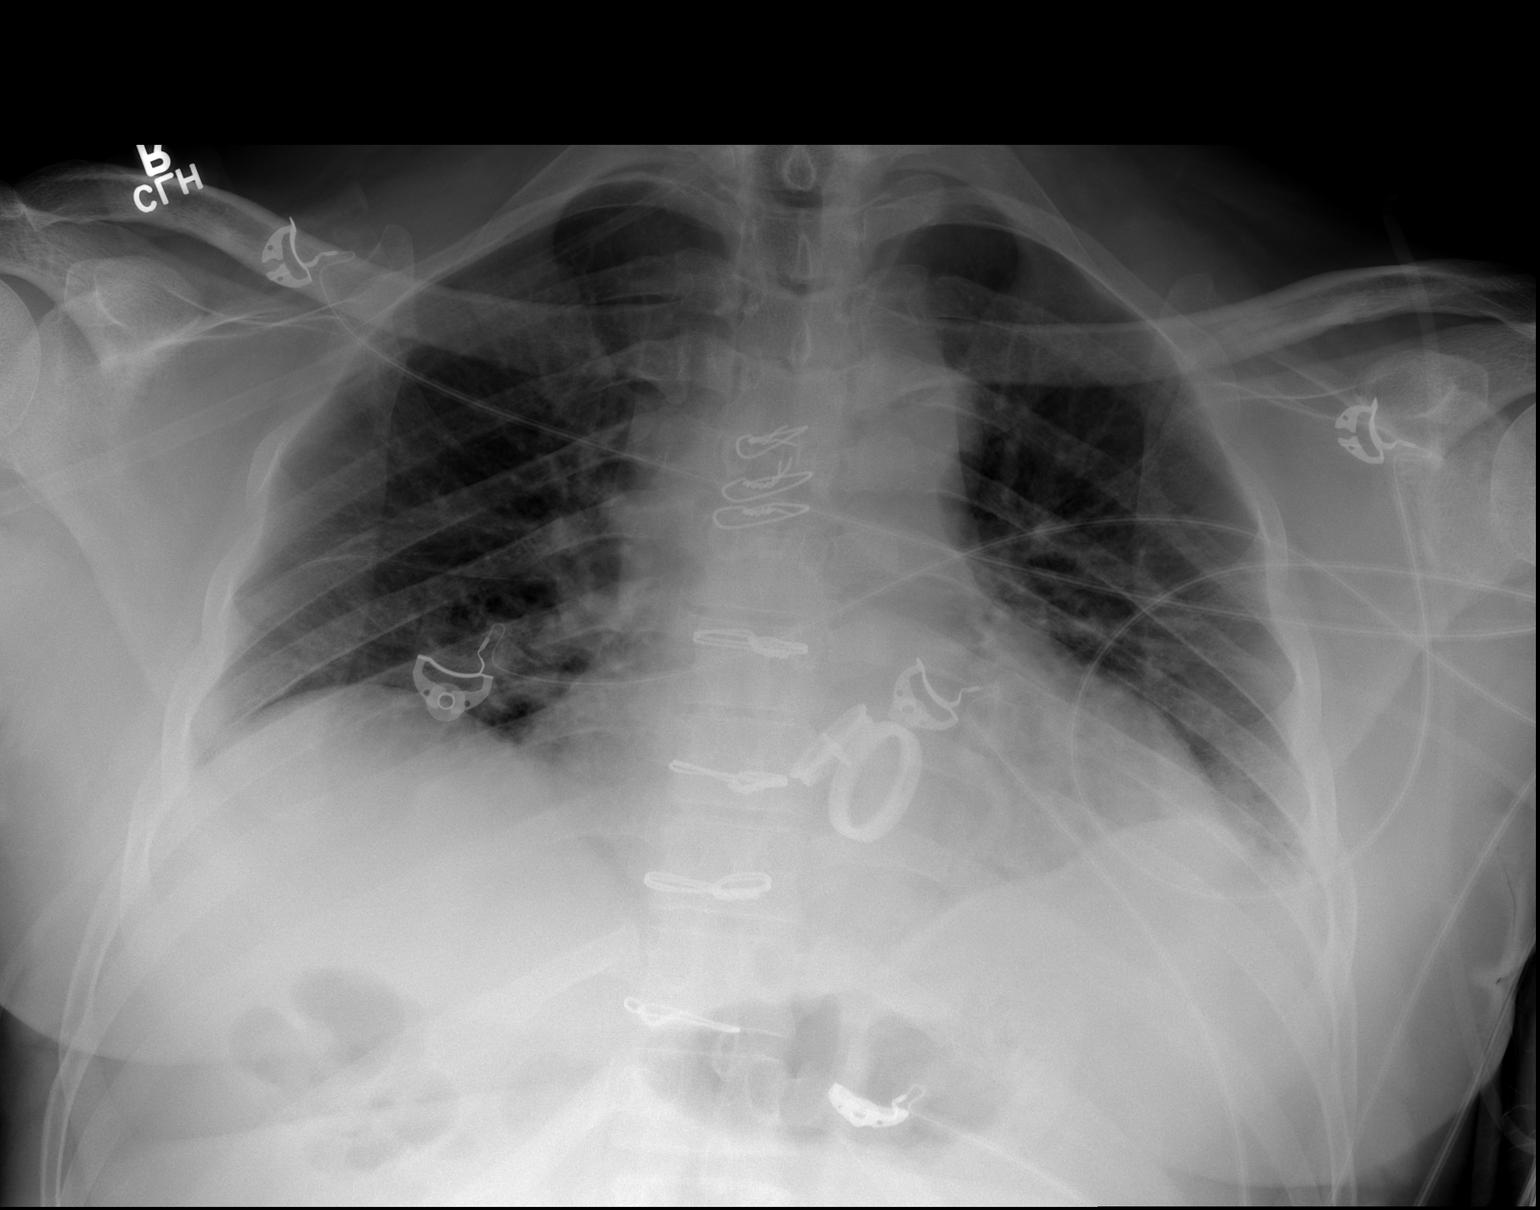

[1 of 1 positions shown; findings below may reference images not displayed]

FINDINGS: Since the previous study, there is interval postoperative
change with sternotomy wires and cardiac valve prostheses.  Shallow
inspiration.  Mild cardiac enlargement with normal pulmonary
vascularity.  No focal airspace disease or consolidation in the
lungs.  No blunting of costophrenic angles.  No pneumothorax.
Mediastinal contours appear intact.
IMPRESSION: Postoperative changes in the mediastinum.  Shallow inspiration with
mild cardiac enlargement.  No focal consolidation.

## 2014-06-13 ENCOUNTER — Other Ambulatory Visit (HOSPITAL_COMMUNITY): Payer: Self-pay | Admitting: Cardiology

## 2014-06-13 ENCOUNTER — Ambulatory Visit (HOSPITAL_COMMUNITY): Payer: 59 | Attending: Cardiology | Admitting: Radiology

## 2014-06-13 DIAGNOSIS — I359 Nonrheumatic aortic valve disorder, unspecified: Secondary | ICD-10-CM

## 2014-06-13 DIAGNOSIS — I059 Rheumatic mitral valve disease, unspecified: Secondary | ICD-10-CM | POA: Insufficient documentation

## 2014-06-13 NOTE — Progress Notes (Signed)
Echocardiogram performed.
